# Patient Record
Sex: Female | Born: 1940 | Race: White | Hispanic: No | Marital: Married
Health system: Southern US, Community
[De-identification: ages and names within clinical notes are randomized; demographics above are authoritative.]

---

## 2001-04-21 ENCOUNTER — Encounter (HOSPITAL_COMMUNITY): Admission: RE | Admit: 2001-04-21 | Discharge: 2001-05-21 | Payer: Self-pay | Admitting: Specialist

## 2003-10-01 ENCOUNTER — Other Ambulatory Visit: Payer: Self-pay

## 2003-11-07 ENCOUNTER — Other Ambulatory Visit: Payer: Self-pay

## 2003-11-07 ENCOUNTER — Ambulatory Visit: Payer: Self-pay | Admitting: Specialist

## 2004-02-14 ENCOUNTER — Ambulatory Visit: Payer: Self-pay | Admitting: Specialist

## 2004-05-21 ENCOUNTER — Emergency Department: Payer: Self-pay | Admitting: General Practice

## 2005-10-28 ENCOUNTER — Observation Stay: Payer: Self-pay | Admitting: Specialist

## 2005-10-28 ENCOUNTER — Other Ambulatory Visit: Payer: Self-pay

## 2005-10-29 ENCOUNTER — Other Ambulatory Visit: Payer: Self-pay

## 2006-01-11 ENCOUNTER — Ambulatory Visit: Payer: Self-pay | Admitting: Specialist

## 2006-01-13 ENCOUNTER — Inpatient Hospital Stay: Payer: Self-pay | Admitting: Specialist

## 2006-02-08 ENCOUNTER — Ambulatory Visit: Payer: Self-pay | Admitting: Specialist

## 2006-04-21 ENCOUNTER — Ambulatory Visit: Payer: Self-pay | Admitting: Specialist

## 2006-07-01 ENCOUNTER — Emergency Department: Payer: Self-pay | Admitting: General Practice

## 2006-07-01 ENCOUNTER — Other Ambulatory Visit: Payer: Self-pay

## 2006-10-21 ENCOUNTER — Ambulatory Visit: Payer: Self-pay | Admitting: Ophthalmology

## 2006-10-21 ENCOUNTER — Other Ambulatory Visit: Payer: Self-pay

## 2006-10-27 ENCOUNTER — Ambulatory Visit: Payer: Self-pay | Admitting: Ophthalmology

## 2007-06-28 ENCOUNTER — Other Ambulatory Visit: Payer: Self-pay

## 2007-06-28 ENCOUNTER — Emergency Department: Payer: Self-pay | Admitting: Emergency Medicine

## 2007-07-21 ENCOUNTER — Ambulatory Visit: Payer: Self-pay | Admitting: Cardiovascular Disease

## 2007-08-26 ENCOUNTER — Emergency Department: Payer: Self-pay | Admitting: Emergency Medicine

## 2007-09-05 ENCOUNTER — Emergency Department (HOSPITAL_COMMUNITY): Admission: EM | Admit: 2007-09-05 | Discharge: 2007-09-05 | Payer: Self-pay | Admitting: Emergency Medicine

## 2008-01-19 ENCOUNTER — Ambulatory Visit: Payer: Self-pay | Admitting: Cardiovascular Disease

## 2008-09-13 ENCOUNTER — Ambulatory Visit: Payer: Self-pay | Admitting: Cardiovascular Disease

## 2009-04-03 ENCOUNTER — Inpatient Hospital Stay: Payer: Self-pay | Admitting: Internal Medicine

## 2009-06-09 ENCOUNTER — Inpatient Hospital Stay: Payer: Self-pay | Admitting: Internal Medicine

## 2009-09-17 ENCOUNTER — Inpatient Hospital Stay: Payer: Self-pay | Admitting: Internal Medicine

## 2009-11-03 ENCOUNTER — Observation Stay: Payer: Self-pay | Admitting: Internal Medicine

## 2010-08-21 ENCOUNTER — Inpatient Hospital Stay: Payer: Self-pay | Admitting: Internal Medicine

## 2010-08-28 LAB — PATHOLOGY REPORT

## 2011-01-22 ENCOUNTER — Ambulatory Visit: Payer: Self-pay | Admitting: Internal Medicine

## 2011-02-18 LAB — URINALYSIS, COMPLETE
Bilirubin,UR: NEGATIVE
Glucose,UR: NEGATIVE mg/dL (ref 0–75)
Hyaline Cast: 3
Leukocyte Esterase: NEGATIVE
Nitrite: NEGATIVE
Ph: 5 (ref 4.5–8.0)
Specific Gravity: 1.016 (ref 1.003–1.030)
Squamous Epithelial: NONE SEEN

## 2011-02-18 LAB — LIPASE, BLOOD: Lipase: 82 U/L (ref 73–393)

## 2011-02-18 LAB — CBC
HGB: 11.7 g/dL — ABNORMAL LOW (ref 12.0–16.0)
RBC: 3.99 10*6/uL (ref 3.80–5.20)
WBC: 7.3 10*3/uL (ref 3.6–11.0)

## 2011-02-18 LAB — RAPID INFLUENZA A&B ANTIGENS

## 2011-02-18 LAB — COMPREHENSIVE METABOLIC PANEL
Albumin: 4 g/dL (ref 3.4–5.0)
Alkaline Phosphatase: 65 U/L (ref 50–136)
Anion Gap: 16 (ref 7–16)
BUN: 36 mg/dL — ABNORMAL HIGH (ref 7–18)
Glucose: 133 mg/dL — ABNORMAL HIGH (ref 65–99)
Potassium: 3.9 mmol/L (ref 3.5–5.1)
SGOT(AST): 34 U/L (ref 15–37)
SGPT (ALT): 28 U/L
Total Protein: 8.7 g/dL — ABNORMAL HIGH (ref 6.4–8.2)

## 2011-02-18 LAB — TROPONIN I: Troponin-I: <0.02 ng/mL

## 2011-02-19 LAB — CBC WITH DIFFERENTIAL/PLATELET
Basophil #: 0.1 10*3/uL (ref 0.0–0.1)
Basophil %: 0.8 %
Eosinophil #: 0.3 10*3/uL (ref 0.0–0.7)
Eosinophil %: 4.4 %
HGB: 10 g/dL — ABNORMAL LOW (ref 12.0–16.0)
Lymphocyte %: 54.2 %
MCH: 29.1 pg (ref 26.0–34.0)
Monocyte #: 0.8 10*3/uL — ABNORMAL HIGH (ref 0.0–0.7)
Monocyte %: 10.9 %
Neutrophil #: 2.1 10*3/uL (ref 1.4–6.5)
Neutrophil %: 29.7 %
RBC: 3.43 10*6/uL — ABNORMAL LOW (ref 3.80–5.20)
WBC: 7.2 10*3/uL (ref 3.6–11.0)

## 2011-02-19 LAB — COMPREHENSIVE METABOLIC PANEL
Alkaline Phosphatase: 52 U/L (ref 50–136)
BUN: 17 mg/dL (ref 7–18)
Bilirubin,Total: 0.5 mg/dL (ref 0.2–1.0)
Creatinine: 1.26 mg/dL (ref 0.60–1.30)
EGFR (African American): 54 — ABNORMAL LOW
SGPT (ALT): 20 U/L
Sodium: 144 mmol/L (ref 136–145)
Total Protein: 7.2 g/dL (ref 6.4–8.2)

## 2011-02-19 LAB — MAGNESIUM: Magnesium: 1.9 mg/dL

## 2011-02-20 ENCOUNTER — Inpatient Hospital Stay: Payer: Self-pay | Admitting: Internal Medicine

## 2011-03-10 ENCOUNTER — Inpatient Hospital Stay: Payer: Self-pay | Admitting: Internal Medicine

## 2011-03-10 LAB — CBC WITH DIFFERENTIAL/PLATELET
Basophil %: 0.8 %
Eosinophil %: 1 %
HCT: 33.7 % — ABNORMAL LOW (ref 35.0–47.0)
Lymphocyte #: 3.1 10*3/uL (ref 1.0–3.6)
MCH: 30.1 pg (ref 26.0–34.0)
Monocyte #: 0.5 10*3/uL (ref 0.0–0.7)
Monocyte %: 6.9 %
Neutrophil %: 45.5 %
RBC: 3.79 10*6/uL — ABNORMAL LOW (ref 3.80–5.20)
WBC: 6.7 10*3/uL (ref 3.6–11.0)

## 2011-03-10 LAB — COMPREHENSIVE METABOLIC PANEL
Alkaline Phosphatase: 59 U/L (ref 50–136)
BUN: 88 mg/dL — ABNORMAL HIGH (ref 7–18)
Calcium, Total: 9.2 mg/dL (ref 8.5–10.1)
Co2: 21 mmol/L (ref 21–32)
EGFR (African American): 6 — ABNORMAL LOW
EGFR (Non-African Amer.): 5 — ABNORMAL LOW
Osmolality: 291 (ref 275–301)
SGOT(AST): 18 U/L (ref 15–37)
SGPT (ALT): 19 U/L
Sodium: 131 mmol/L — ABNORMAL LOW (ref 136–145)

## 2011-03-10 LAB — URINALYSIS, COMPLETE
Ketone: NEGATIVE
Nitrite: NEGATIVE
Ph: 5 (ref 4.5–8.0)
Protein: NEGATIVE
RBC,UR: 3 /HPF (ref 0–5)

## 2011-03-10 LAB — SODIUM, URINE, RANDOM: Sodium, Urine Random: 38 mmol/L (ref 20–110)

## 2011-03-11 LAB — COMPREHENSIVE METABOLIC PANEL
Anion Gap: 11 (ref 7–16)
BUN: 78 mg/dL — ABNORMAL HIGH (ref 7–18)
Calcium, Total: 8.6 mg/dL (ref 8.5–10.1)
Chloride: 100 mmol/L (ref 98–107)
EGFR (African American): 9 — ABNORMAL LOW
EGFR (Non-African Amer.): 8 — ABNORMAL LOW
Osmolality: 287 (ref 275–301)
Potassium: 4.7 mmol/L (ref 3.5–5.1)
SGOT(AST): 19 U/L (ref 15–37)
Sodium: 132 mmol/L — ABNORMAL LOW (ref 136–145)
Total Protein: 7 g/dL (ref 6.4–8.2)

## 2011-03-11 LAB — UR PROT ELECTROPHORESIS, URINE RANDOM

## 2011-03-11 LAB — PROTEIN ELECTROPHORESIS(ARMC)

## 2011-03-12 LAB — BASIC METABOLIC PANEL
BUN: 52 mg/dL — ABNORMAL HIGH (ref 7–18)
Creatinine: 3.03 mg/dL — ABNORMAL HIGH (ref 0.60–1.30)
EGFR (African American): 20 — ABNORMAL LOW
EGFR (Non-African Amer.): 16 — ABNORMAL LOW
Glucose: 87 mg/dL (ref 65–99)
Potassium: 5.3 mmol/L — ABNORMAL HIGH (ref 3.5–5.1)
Sodium: 140 mmol/L (ref 136–145)

## 2011-03-12 LAB — URINE CULTURE

## 2011-03-13 LAB — BASIC METABOLIC PANEL
BUN: 26 mg/dL — ABNORMAL HIGH (ref 7–18)
Creatinine: 1.77 mg/dL — ABNORMAL HIGH (ref 0.60–1.30)
EGFR (African American): 37 — ABNORMAL LOW
EGFR (Non-African Amer.): 30 — ABNORMAL LOW
Glucose: 135 mg/dL — ABNORMAL HIGH (ref 65–99)
Potassium: 4.6 mmol/L (ref 3.5–5.1)
Sodium: 143 mmol/L (ref 136–145)

## 2011-03-14 LAB — CBC WITH DIFFERENTIAL/PLATELET
Basophil #: 0.1 10*3/uL (ref 0.0–0.1)
Basophil %: 0.9 %
Eosinophil %: 4.5 %
HGB: 10.1 g/dL — ABNORMAL LOW (ref 12.0–16.0)
Lymphocyte #: 3.9 10*3/uL — ABNORMAL HIGH (ref 1.0–3.6)
Lymphocyte %: 65.4 %
MCH: 30.5 pg (ref 26.0–34.0)
MCV: 90 fL (ref 80–100)
Monocyte #: 0.5 10*3/uL (ref 0.0–0.7)
Platelet: 142 10*3/uL — ABNORMAL LOW (ref 150–440)
WBC: 6 10*3/uL (ref 3.6–11.0)

## 2011-03-14 LAB — BASIC METABOLIC PANEL
BUN: 10 mg/dL (ref 7–18)
Chloride: 107 mmol/L (ref 98–107)
Creatinine: 1.6 mg/dL — ABNORMAL HIGH (ref 0.60–1.30)
EGFR (Non-African Amer.): 34 — ABNORMAL LOW
Potassium: 4.2 mmol/L (ref 3.5–5.1)
Sodium: 140 mmol/L (ref 136–145)

## 2011-03-15 LAB — BASIC METABOLIC PANEL
Anion Gap: 8 (ref 7–16)
BUN: 5 mg/dL — ABNORMAL LOW (ref 7–18)
Chloride: 108 mmol/L — ABNORMAL HIGH (ref 98–107)
EGFR (African American): 47 — ABNORMAL LOW
EGFR (Non-African Amer.): 39 — ABNORMAL LOW
Glucose: 116 mg/dL — ABNORMAL HIGH (ref 65–99)
Osmolality: 278 (ref 275–301)

## 2011-03-15 LAB — CBC WITH DIFFERENTIAL/PLATELET
Basophil %: 0.8 %
Eosinophil #: 0.3 10*3/uL (ref 0.0–0.7)
HCT: 28.5 % — ABNORMAL LOW (ref 35.0–47.0)
HGB: 9.6 g/dL — ABNORMAL LOW (ref 12.0–16.0)
Lymphocyte #: 3 10*3/uL (ref 1.0–3.6)
Lymphocyte %: 59.9 %
MCH: 30.2 pg (ref 26.0–34.0)
MCHC: 33.7 g/dL (ref 32.0–36.0)
MCV: 90 fL (ref 80–100)
Monocyte #: 0.5 10*3/uL (ref 0.0–0.7)
Neutrophil #: 1.1 10*3/uL — ABNORMAL LOW (ref 1.4–6.5)
RDW: 16.3 % — ABNORMAL HIGH (ref 11.5–14.5)

## 2011-03-16 LAB — BASIC METABOLIC PANEL
Anion Gap: 10 (ref 7–16)
BUN: 3 mg/dL — ABNORMAL LOW (ref 7–18)
Creatinine: 1.2 mg/dL (ref 0.60–1.30)
EGFR (African American): 57 — ABNORMAL LOW
EGFR (Non-African Amer.): 47 — ABNORMAL LOW
Glucose: 115 mg/dL — ABNORMAL HIGH (ref 65–99)
Sodium: 143 mmol/L (ref 136–145)

## 2011-03-16 LAB — CBC WITH DIFFERENTIAL/PLATELET
Eosinophil #: 0.4 10*3/uL (ref 0.0–0.7)
HGB: 9.3 g/dL — ABNORMAL LOW (ref 12.0–16.0)
Monocyte #: 0.5 10*3/uL (ref 0.0–0.7)
Neutrophil %: 25.1 %
Platelet: 123 10*3/uL — ABNORMAL LOW (ref 150–440)
RBC: 3.1 10*6/uL — ABNORMAL LOW (ref 3.80–5.20)

## 2011-03-17 LAB — BASIC METABOLIC PANEL
Chloride: 109 mmol/L — ABNORMAL HIGH (ref 98–107)
Creatinine: 1.44 mg/dL — ABNORMAL HIGH (ref 0.60–1.30)
Glucose: 113 mg/dL — ABNORMAL HIGH (ref 65–99)
Osmolality: 279 (ref 275–301)

## 2011-03-17 LAB — CBC WITH DIFFERENTIAL/PLATELET
Basophil %: 0.8 %
Eosinophil %: 7.4 %
HCT: 28.6 % — ABNORMAL LOW (ref 35.0–47.0)
HGB: 9.6 g/dL — ABNORMAL LOW (ref 12.0–16.0)
Lymphocyte #: 2.9 10*3/uL (ref 1.0–3.6)
MCH: 30.2 pg (ref 26.0–34.0)
MCV: 91 fL (ref 80–100)
Monocyte #: 0.4 10*3/uL (ref 0.0–0.7)
Monocyte %: 8.2 %
Neutrophil #: 1.1 10*3/uL — ABNORMAL LOW (ref 1.4–6.5)
Platelet: 136 10*3/uL — ABNORMAL LOW (ref 150–440)
RBC: 3.16 10*6/uL — ABNORMAL LOW (ref 3.80–5.20)
WBC: 4.8 10*3/uL (ref 3.6–11.0)

## 2011-03-18 LAB — BASIC METABOLIC PANEL
Anion Gap: 10 (ref 7–16)
Chloride: 115 mmol/L — ABNORMAL HIGH (ref 98–107)
Co2: 24 mmol/L (ref 21–32)
Osmolality: 293 (ref 275–301)

## 2011-03-18 LAB — CBC WITH DIFFERENTIAL/PLATELET
Basophil #: 0.1 10*3/uL (ref 0.0–0.1)
Eosinophil #: 0.4 10*3/uL (ref 0.0–0.7)
HCT: 26.4 % — ABNORMAL LOW (ref 35.0–47.0)
HGB: 8.6 g/dL — ABNORMAL LOW (ref 12.0–16.0)
Lymphocyte #: 2.5 10*3/uL (ref 1.0–3.6)
MCHC: 32.6 g/dL (ref 32.0–36.0)
Neutrophil #: 1.8 10*3/uL (ref 1.4–6.5)
RBC: 2.91 10*6/uL — ABNORMAL LOW (ref 3.80–5.20)
RDW: 15 % — ABNORMAL HIGH (ref 11.5–14.5)
WBC: 5.3 10*3/uL (ref 3.6–11.0)

## 2011-03-18 LAB — MAGNESIUM: Magnesium: 1.5 mg/dL — ABNORMAL LOW

## 2011-03-19 LAB — BASIC METABOLIC PANEL
Calcium, Total: 8 mg/dL — ABNORMAL LOW (ref 8.5–10.1)
Chloride: 114 mmol/L — ABNORMAL HIGH (ref 98–107)
Co2: 23 mmol/L (ref 21–32)
EGFR (African American): >60
Glucose: 109 mg/dL — ABNORMAL HIGH (ref 65–99)
Potassium: 4.2 mmol/L (ref 3.5–5.1)

## 2011-03-19 LAB — CBC WITH DIFFERENTIAL/PLATELET
Basophil #: 0 10*3/uL (ref 0.0–0.1)
Basophil %: 0.5 %
Eosinophil #: 0.4 10*3/uL (ref 0.0–0.7)
HCT: 25.4 % — ABNORMAL LOW (ref 35.0–47.0)
Lymphocyte %: 43.8 %
MCH: 30.1 pg (ref 26.0–34.0)
MCHC: 33.3 g/dL (ref 32.0–36.0)
Monocyte #: 0.6 10*3/uL (ref 0.0–0.7)
Neutrophil #: 2.5 10*3/uL (ref 1.4–6.5)
Neutrophil %: 39.7 %
RDW: 16.8 % — ABNORMAL HIGH (ref 11.5–14.5)

## 2011-03-19 LAB — MAGNESIUM
Magnesium: 1.5 mg/dL — ABNORMAL LOW
Magnesium: 2.3 mg/dL

## 2011-07-14 ENCOUNTER — Ambulatory Visit: Payer: Self-pay | Admitting: Internal Medicine

## 2011-07-14 LAB — CREATININE, SERUM: Creatinine: 1.12 mg/dL (ref 0.60–1.30)

## 2011-08-11 ENCOUNTER — Emergency Department: Payer: Self-pay | Admitting: Unknown Physician Specialty

## 2011-08-11 LAB — CBC
HCT: 33.2 % — ABNORMAL LOW (ref 35.0–47.0)
HGB: 10.9 g/dL — ABNORMAL LOW (ref 12.0–16.0)
MCH: 28.9 pg (ref 26.0–34.0)
MCHC: 33 g/dL (ref 32.0–36.0)
MCV: 88 fL (ref 80–100)
Platelet: 190 10*3/uL (ref 150–440)
RBC: 3.79 10*6/uL — ABNORMAL LOW (ref 3.80–5.20)
RDW: 14.5 % (ref 11.5–14.5)
WBC: 7.4 10*3/uL (ref 3.6–11.0)

## 2011-08-11 LAB — BASIC METABOLIC PANEL
Anion Gap: 8 (ref 7–16)
BUN: 13 mg/dL (ref 7–18)
Calcium, Total: 8.6 mg/dL (ref 8.5–10.1)
Chloride: 108 mmol/L — ABNORMAL HIGH (ref 98–107)
Co2: 28 mmol/L (ref 21–32)
Creatinine: 1.2 mg/dL (ref 0.60–1.30)
EGFR (African American): 53 — ABNORMAL LOW
EGFR (Non-African Amer.): 46 — ABNORMAL LOW
Glucose: 182 mg/dL — ABNORMAL HIGH (ref 65–99)
Osmolality: 292 (ref 275–301)
Potassium: 4.5 mmol/L (ref 3.5–5.1)
Sodium: 144 mmol/L (ref 136–145)

## 2011-08-11 LAB — HEPATIC FUNCTION PANEL A (ARMC)
Albumin: 3.4 g/dL (ref 3.4–5.0)
Alkaline Phosphatase: 92 U/L (ref 50–136)
Bilirubin, Direct: <0.1 mg/dL (ref 0.00–0.20)
Bilirubin,Total: 0.2 mg/dL (ref 0.2–1.0)
SGOT(AST): 29 U/L (ref 15–37)
SGPT (ALT): 26 U/L
Total Protein: 7.6 g/dL (ref 6.4–8.2)

## 2011-08-11 LAB — TROPONIN I: Troponin-I: 0.03 ng/mL

## 2011-08-11 LAB — CK TOTAL AND CKMB (NOT AT ARMC)
CK, Total: 105 U/L (ref 21–215)
CK-MB: 0.5 ng/mL (ref 0.5–3.6)

## 2011-08-11 LAB — PRO B NATRIURETIC PEPTIDE: B-Type Natriuretic Peptide: 207 pg/mL — ABNORMAL HIGH (ref 0–125)

## 2011-08-11 LAB — MAGNESIUM: Magnesium: 1.7 mg/dL — ABNORMAL LOW

## 2012-04-10 IMAGING — CR DG UGI W/ SMALL BOWEL
1 series · 2 of 2 positions shown · non-contrast
Comparison: none

REASON FOR EXAM: vomiting.
COMMENTS:

PROCEDURE:     FL  - FL UPPER GI WITH SMALL BOWEL  - August 29, 2010  [DATE]
RESULT:     Comparisons:  None

[Series 1: view not recorded · 0.17mm/px · 2 of 2 slices shown]
[im 1/2]
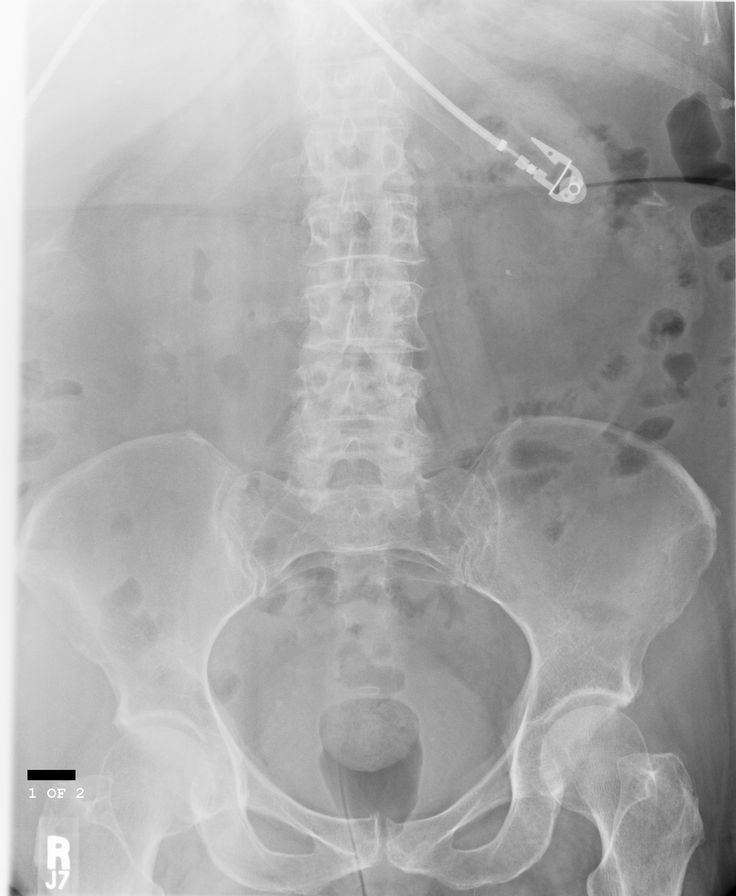
[im 2/2]
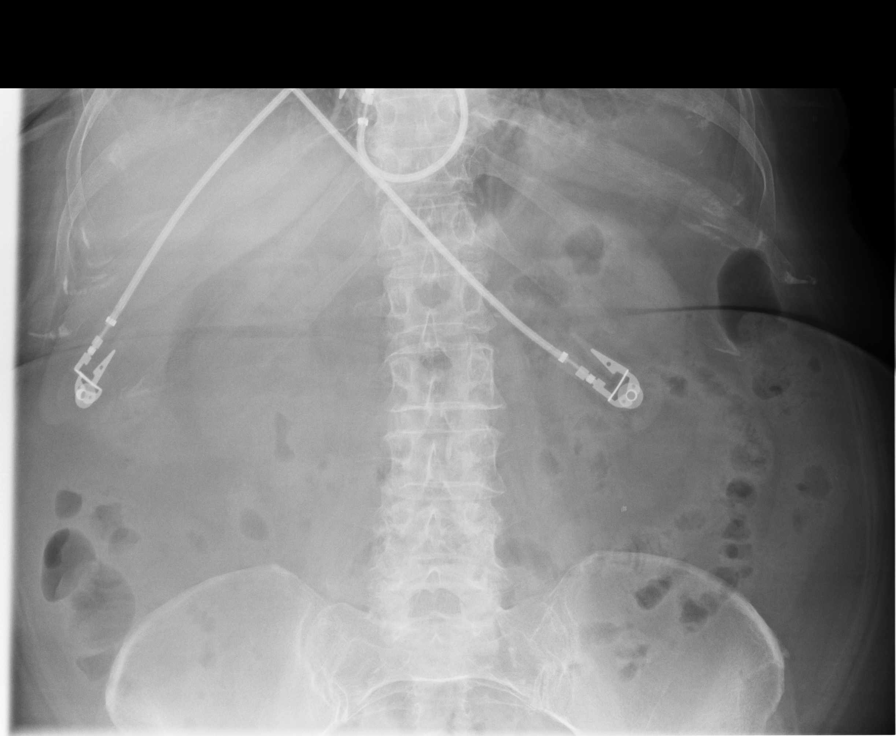

[2 of 2 positions shown; findings below may reference images not displayed]

FINDINGS: Supine radiograph of the abdomen is provided.

There is a nonspecific bowel gas pattern. There is no bowel dilatation to
suggest obstruction. There is no pathologic calcification along the expected
course of the ureters. There is no evidence of pneumoperitoneum, portal
venous gas, or pneumatosis.

The osseous structures are unremarkable.
IMPRESSION: Unremarkable KUB.

The patient was unable to tolerate the upper GI examination.

## 2012-09-08 ENCOUNTER — Observation Stay: Payer: Self-pay | Admitting: Internal Medicine

## 2012-09-08 LAB — URINALYSIS, COMPLETE
Bilirubin,UR: NEGATIVE
Glucose,UR: NEGATIVE mg/dL (ref 0–75)
Hyaline Cast: 21
Ph: 5 (ref 4.5–8.0)
Protein: NEGATIVE
RBC,UR: 2 /HPF (ref 0–5)
Specific Gravity: 1.008 (ref 1.003–1.030)

## 2012-09-08 LAB — TROPONIN I
Troponin-I: <0.02 ng/mL
Troponin-I: <0.02 ng/mL

## 2012-09-08 LAB — CK TOTAL AND CKMB (NOT AT ARMC)
CK, Total: 103 U/L (ref 21–215)
CK, Total: 99 U/L (ref 21–215)
CK-MB: <0.5 ng/mL — ABNORMAL LOW (ref 0.5–3.6)
CK-MB: <0.5 ng/mL — ABNORMAL LOW (ref 0.5–3.6)

## 2012-09-08 LAB — COMPREHENSIVE METABOLIC PANEL
Albumin: 3.6 g/dL (ref 3.4–5.0)
Alkaline Phosphatase: 86 U/L (ref 50–136)
Bilirubin,Total: 0.4 mg/dL (ref 0.2–1.0)
Calcium, Total: 8.8 mg/dL (ref 8.5–10.1)
Glucose: 140 mg/dL — ABNORMAL HIGH (ref 65–99)
SGOT(AST): 31 U/L (ref 15–37)
SGPT (ALT): 26 U/L (ref 12–78)
Total Protein: 7.9 g/dL (ref 6.4–8.2)

## 2012-09-08 LAB — CBC
HCT: 32.4 % — ABNORMAL LOW (ref 35.0–47.0)
MCHC: 35.2 g/dL (ref 32.0–36.0)
Platelet: 236 10*3/uL (ref 150–440)
RBC: 3.77 10*6/uL — ABNORMAL LOW (ref 3.80–5.20)
RDW: 13.7 % (ref 11.5–14.5)

## 2012-09-08 LAB — PRO B NATRIURETIC PEPTIDE: B-Type Natriuretic Peptide: 217 pg/mL — ABNORMAL HIGH (ref 0–125)

## 2012-09-09 LAB — BASIC METABOLIC PANEL
Anion Gap: 5 — ABNORMAL LOW (ref 7–16)
BUN: 26 mg/dL — ABNORMAL HIGH (ref 7–18)
Chloride: 95 mmol/L — ABNORMAL LOW (ref 98–107)
Co2: 30 mmol/L (ref 21–32)
EGFR (African American): 38 — ABNORMAL LOW
Glucose: 109 mg/dL — ABNORMAL HIGH (ref 65–99)
Osmolality: 266 (ref 275–301)

## 2012-09-09 LAB — CBC WITH DIFFERENTIAL/PLATELET
Basophil #: 0.1 10*3/uL (ref 0.0–0.1)
Eosinophil #: 0.5 10*3/uL (ref 0.0–0.7)
Lymphocyte #: 3.7 10*3/uL — ABNORMAL HIGH (ref 1.0–3.6)
MCHC: 35.3 g/dL (ref 32.0–36.0)
MCV: 87 fL (ref 80–100)
Monocyte #: 0.9 x10 3/mm (ref 0.2–0.9)
WBC: 7.6 10*3/uL (ref 3.6–11.0)

## 2012-09-09 LAB — LIPID PANEL
Cholesterol: 161 mg/dL (ref 0–200)
HDL Cholesterol: 48 mg/dL (ref 40–60)
Triglycerides: 132 mg/dL (ref 0–200)
VLDL Cholesterol, Calc: 26 mg/dL (ref 5–40)

## 2012-09-09 LAB — HEMOGLOBIN A1C: Hemoglobin A1C: 7.2 % — ABNORMAL HIGH (ref 4.2–6.3)

## 2012-09-09 LAB — CK TOTAL AND CKMB (NOT AT ARMC)
CK, Total: 95 U/L (ref 21–215)
CK-MB: 0.8 ng/mL (ref 0.5–3.6)

## 2012-09-10 LAB — HEMOGLOBIN: HGB: 11.2 g/dL — ABNORMAL LOW (ref 12.0–16.0)

## 2012-09-10 LAB — CREATININE, SERUM: EGFR (African American): 45 — ABNORMAL LOW

## 2012-09-10 LAB — BASIC METABOLIC PANEL
BUN: 23 mg/dL — ABNORMAL HIGH (ref 7–18)
Calcium, Total: 9 mg/dL (ref 8.5–10.1)
Chloride: 98 mmol/L (ref 98–107)
EGFR (African American): 44 — ABNORMAL LOW
EGFR (Non-African Amer.): 38 — ABNORMAL LOW
Sodium: 130 mmol/L — ABNORMAL LOW (ref 136–145)

## 2012-09-12 ENCOUNTER — Emergency Department: Payer: Self-pay | Admitting: Emergency Medicine

## 2012-09-12 LAB — APTT: Activated PTT: 29.5 secs (ref 23.6–35.9)

## 2012-09-12 LAB — COMPREHENSIVE METABOLIC PANEL
Anion Gap: 6 — ABNORMAL LOW (ref 7–16)
Bilirubin,Total: 0.4 mg/dL (ref 0.2–1.0)
Creatinine: 1.12 mg/dL (ref 0.60–1.30)
EGFR (Non-African Amer.): 49 — ABNORMAL LOW
Osmolality: 267 (ref 275–301)
SGOT(AST): 25 U/L (ref 15–37)

## 2012-09-12 LAB — CBC
HCT: 33.3 % — ABNORMAL LOW (ref 35.0–47.0)
MCH: 30.5 pg (ref 26.0–34.0)
MCHC: 35 g/dL (ref 32.0–36.0)
RBC: 3.82 10*6/uL (ref 3.80–5.20)
RDW: 14.3 % (ref 11.5–14.5)

## 2012-09-12 LAB — TROPONIN I: Troponin-I: <0.02 ng/mL

## 2012-09-12 LAB — PROTIME-INR: INR: 0.9

## 2012-09-16 LAB — CBC
HCT: 32.5 % — ABNORMAL LOW (ref 35.0–47.0)
MCH: 30.6 pg (ref 26.0–34.0)
Platelet: 252 10*3/uL (ref 150–440)
RBC: 3.73 10*6/uL — ABNORMAL LOW (ref 3.80–5.20)
RDW: 13.6 % (ref 11.5–14.5)
WBC: 7.6 10*3/uL (ref 3.6–11.0)

## 2012-09-16 LAB — BASIC METABOLIC PANEL
Calcium, Total: 8.8 mg/dL (ref 8.5–10.1)
Chloride: 93 mmol/L — ABNORMAL LOW (ref 98–107)
Co2: 28 mmol/L (ref 21–32)
Creatinine: 1.62 mg/dL — ABNORMAL HIGH (ref 0.60–1.30)
EGFR (African American): 37 — ABNORMAL LOW
Glucose: 135 mg/dL — ABNORMAL HIGH (ref 65–99)
Osmolality: 263 (ref 275–301)
Sodium: 128 mmol/L — ABNORMAL LOW (ref 136–145)

## 2012-09-16 LAB — CK TOTAL AND CKMB (NOT AT ARMC): CK, Total: 126 U/L (ref 21–215)

## 2012-09-16 LAB — PROTIME-INR
INR: 1
Prothrombin Time: 13.6 secs (ref 11.5–14.7)

## 2012-09-16 LAB — TROPONIN I: Troponin-I: <0.02 ng/mL

## 2012-09-17 ENCOUNTER — Observation Stay: Payer: Self-pay | Admitting: Internal Medicine

## 2012-09-17 LAB — TROPONIN I
Troponin-I: <0.02 ng/mL
Troponin-I: <0.02 ng/mL

## 2012-09-17 LAB — CK TOTAL AND CKMB (NOT AT ARMC)
CK, Total: 125 U/L (ref 21–215)
CK-MB: <0.5 ng/mL — ABNORMAL LOW (ref 0.5–3.6)
CK-MB: 0.9 ng/mL (ref 0.5–3.6)

## 2012-09-18 LAB — BASIC METABOLIC PANEL
Calcium, Total: 8.6 mg/dL (ref 8.5–10.1)
EGFR (African American): 51 — ABNORMAL LOW
Potassium: 4.2 mmol/L (ref 3.5–5.1)

## 2012-10-03 ENCOUNTER — Ambulatory Visit: Payer: Self-pay | Admitting: Internal Medicine

## 2012-10-12 ENCOUNTER — Inpatient Hospital Stay: Payer: Self-pay | Admitting: Internal Medicine

## 2012-10-12 LAB — CBC
HGB: 12.1 g/dL (ref 12.0–16.0)
MCH: 30 pg (ref 26.0–34.0)
MCHC: 35 g/dL (ref 32.0–36.0)
MCV: 86 fL (ref 80–100)
Platelet: 217 10*3/uL (ref 150–440)
RBC: 4.02 10*6/uL (ref 3.80–5.20)
RDW: 13.3 % (ref 11.5–14.5)
WBC: 7.7 10*3/uL (ref 3.6–11.0)

## 2012-10-12 LAB — URINALYSIS, COMPLETE
Bilirubin,UR: NEGATIVE
Blood: NEGATIVE
Glucose,UR: NEGATIVE mg/dL (ref 0–75)
Hyaline Cast: 39
Ketone: NEGATIVE
Nitrite: NEGATIVE
Ph: 5 (ref 4.5–8.0)
Protein: NEGATIVE
RBC,UR: <1 /HPF (ref 0–5)
Squamous Epithelial: 3
WBC UR: <1 /HPF (ref 0–5)

## 2012-10-12 LAB — COMPREHENSIVE METABOLIC PANEL
Anion Gap: 10 (ref 7–16)
Bilirubin,Total: 0.6 mg/dL (ref 0.2–1.0)
Calcium, Total: 8.9 mg/dL (ref 8.5–10.1)
Chloride: 82 mmol/L — ABNORMAL LOW (ref 98–107)
Co2: 25 mmol/L (ref 21–32)
EGFR (African American): 17 — ABNORMAL LOW
EGFR (Non-African Amer.): 15 — ABNORMAL LOW
Osmolality: 251 (ref 275–301)
Potassium: 3.5 mmol/L (ref 3.5–5.1)
SGPT (ALT): 25 U/L (ref 12–78)

## 2012-10-12 LAB — TROPONIN I: Troponin-I: <0.02 ng/mL

## 2012-10-12 LAB — PRO B NATRIURETIC PEPTIDE: B-Type Natriuretic Peptide: 191 pg/mL — ABNORMAL HIGH (ref 0–125)

## 2012-10-13 LAB — SODIUM, URINE, RANDOM: Sodium, Urine Random: 38 mmol/L (ref 20–110)

## 2012-10-13 LAB — CBC WITH DIFFERENTIAL/PLATELET
Basophil #: 0 10*3/uL (ref 0.0–0.1)
Basophil %: 0.8 %
HCT: 31.4 % — ABNORMAL LOW (ref 35.0–47.0)
HGB: 11.2 g/dL — ABNORMAL LOW (ref 12.0–16.0)
Lymphocyte #: 2.8 10*3/uL (ref 1.0–3.6)
MCH: 30.5 pg (ref 26.0–34.0)
MCHC: 35.5 g/dL (ref 32.0–36.0)
Neutrophil #: 2.3 10*3/uL (ref 1.4–6.5)
RBC: 3.66 10*6/uL — ABNORMAL LOW (ref 3.80–5.20)
RDW: 13.4 % (ref 11.5–14.5)

## 2012-10-13 LAB — COMPREHENSIVE METABOLIC PANEL
Alkaline Phosphatase: 67 U/L (ref 50–136)
Bilirubin,Total: 0.5 mg/dL (ref 0.2–1.0)
Calcium, Total: 8.3 mg/dL — ABNORMAL LOW (ref 8.5–10.1)
Chloride: 93 mmol/L — ABNORMAL LOW (ref 98–107)
Glucose: 103 mg/dL — ABNORMAL HIGH (ref 65–99)
Osmolality: 265 (ref 275–301)
SGOT(AST): 37 U/L (ref 15–37)
Sodium: 127 mmol/L — ABNORMAL LOW (ref 136–145)
Total Protein: 6.9 g/dL (ref 6.4–8.2)

## 2012-10-13 LAB — SODIUM: Sodium: 129 mmol/L — ABNORMAL LOW (ref 136–145)

## 2012-10-14 LAB — BASIC METABOLIC PANEL
Anion Gap: 7 (ref 7–16)
BUN: 24 mg/dL — ABNORMAL HIGH (ref 7–18)
Calcium, Total: 8.6 mg/dL (ref 8.5–10.1)
Co2: 23 mmol/L (ref 21–32)
Creatinine: 1.57 mg/dL — ABNORMAL HIGH (ref 0.60–1.30)
EGFR (Non-African Amer.): 33 — ABNORMAL LOW
Sodium: 132 mmol/L — ABNORMAL LOW (ref 136–145)

## 2012-10-15 LAB — BASIC METABOLIC PANEL
Anion Gap: 4 — ABNORMAL LOW (ref 7–16)
BUN: 14 mg/dL (ref 7–18)
Calcium, Total: 8.4 mg/dL — ABNORMAL LOW (ref 8.5–10.1)
Chloride: 105 mmol/L (ref 98–107)
EGFR (African American): 50 — ABNORMAL LOW
EGFR (Non-African Amer.): 43 — ABNORMAL LOW
Glucose: 89 mg/dL (ref 65–99)
Potassium: 4.2 mmol/L (ref 3.5–5.1)

## 2012-10-16 LAB — BASIC METABOLIC PANEL
Anion Gap: 7 (ref 7–16)
Creatinine: 1.12 mg/dL (ref 0.60–1.30)
EGFR (Non-African Amer.): 49 — ABNORMAL LOW
Glucose: 86 mg/dL (ref 65–99)

## 2012-10-17 LAB — PLATELET COUNT: Platelet: 148 10*3/uL — ABNORMAL LOW (ref 150–440)

## 2012-10-20 IMAGING — CR DG CHEST 2V
1 series · 3 of 3 positions shown · non-contrast
Comparison: none

REASON FOR EXAM: arf
COMMENTS:

[Series 1: ap · 0.17mm/px · 3 of 3 slices shown]
[im 1/3]
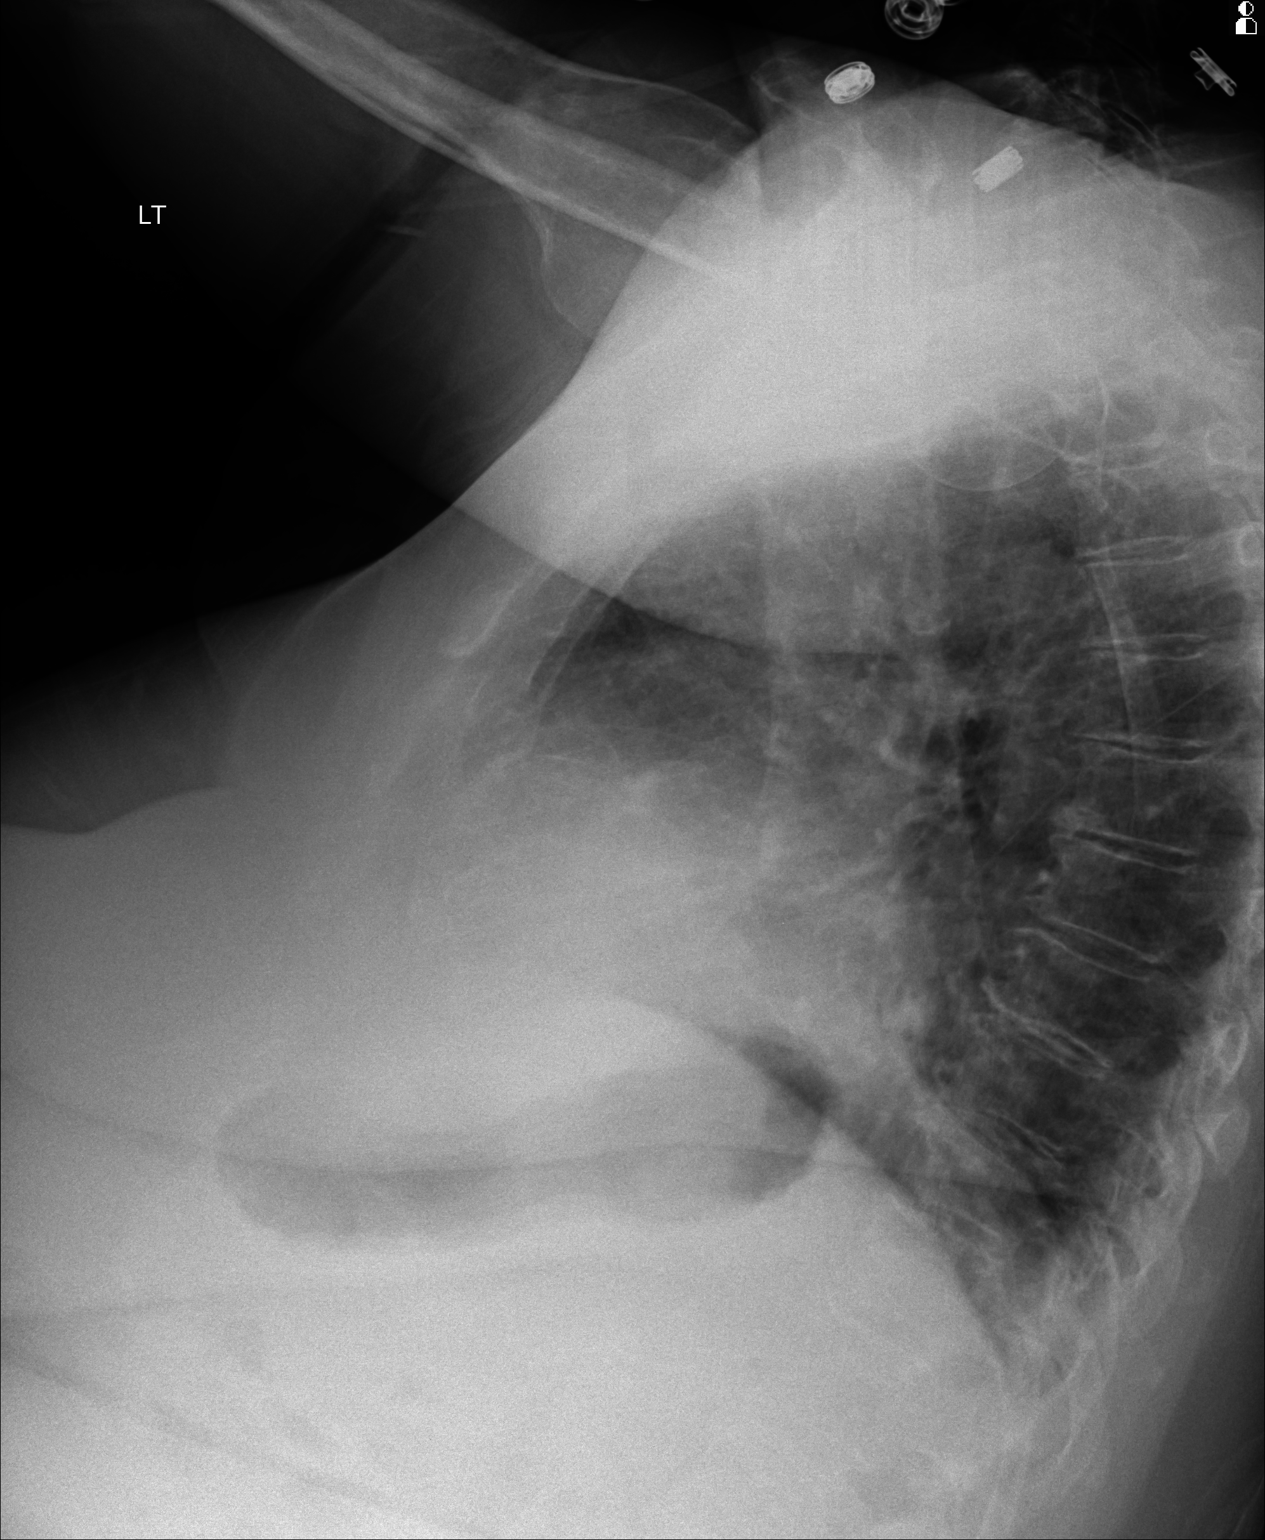
[im 2/3]
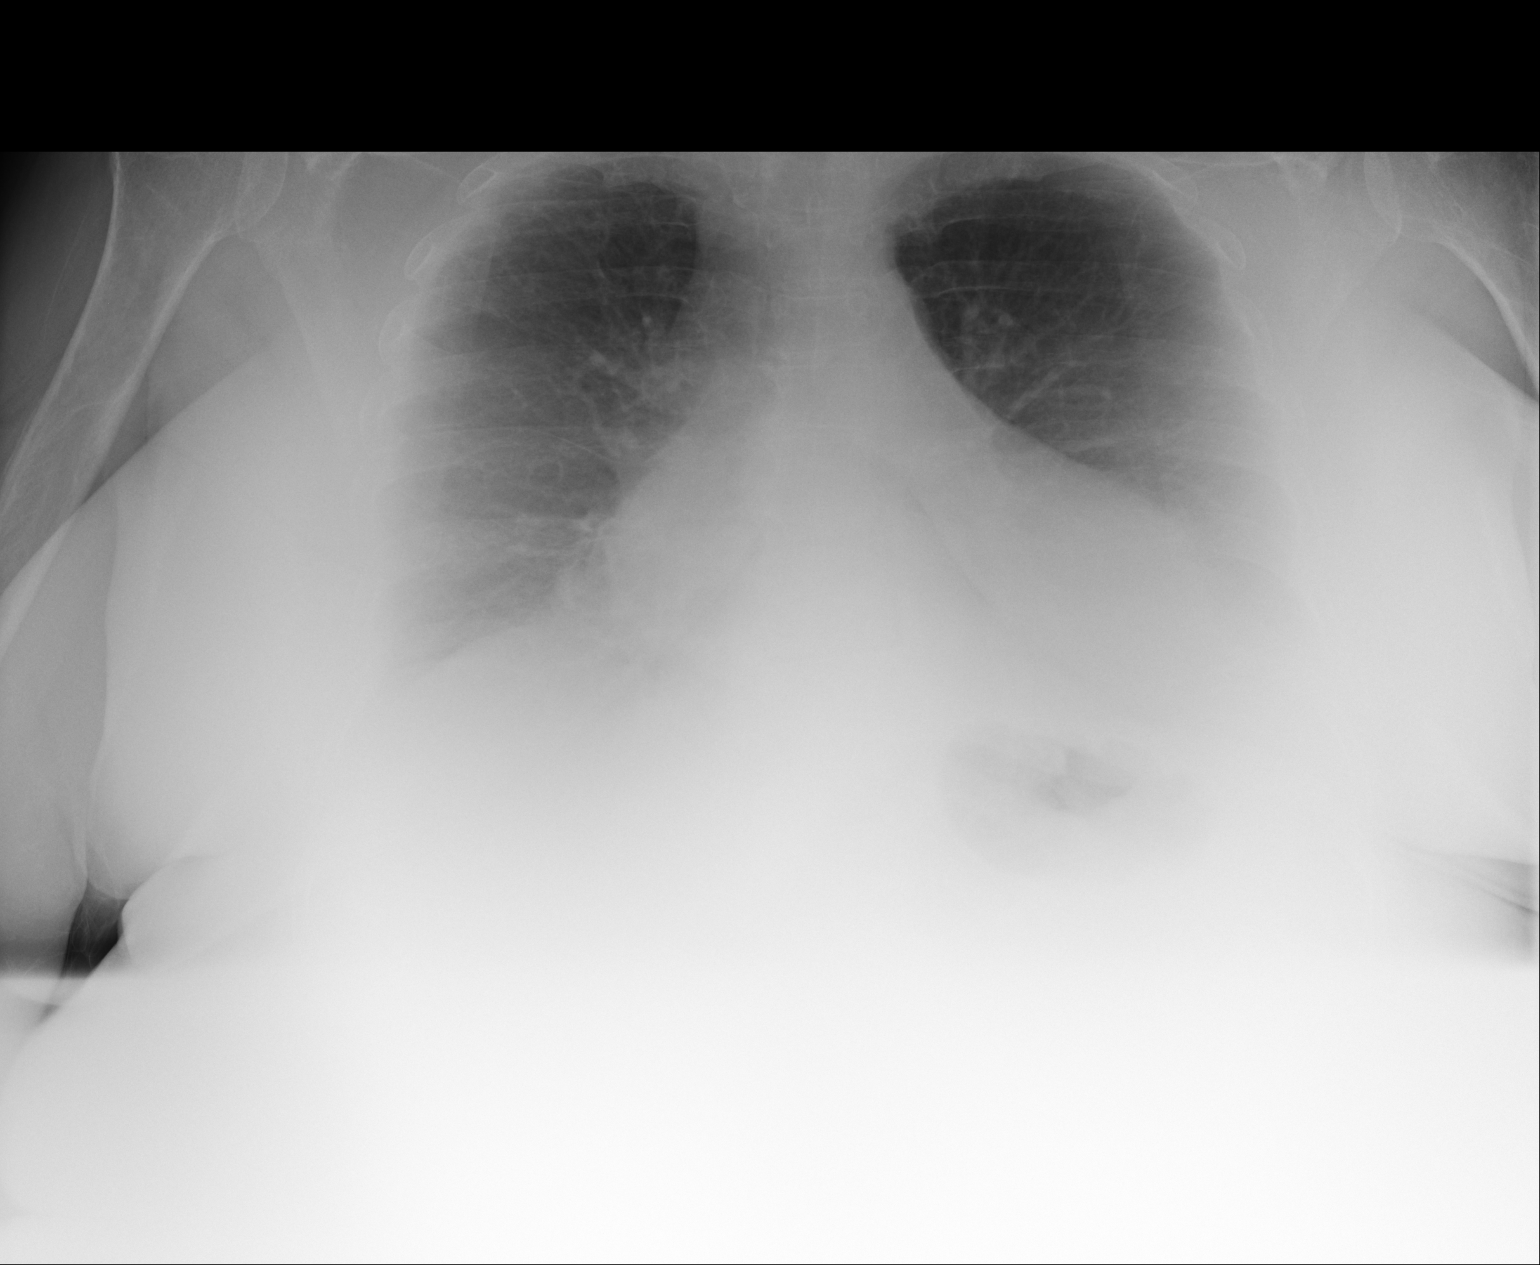
[im 3/3]
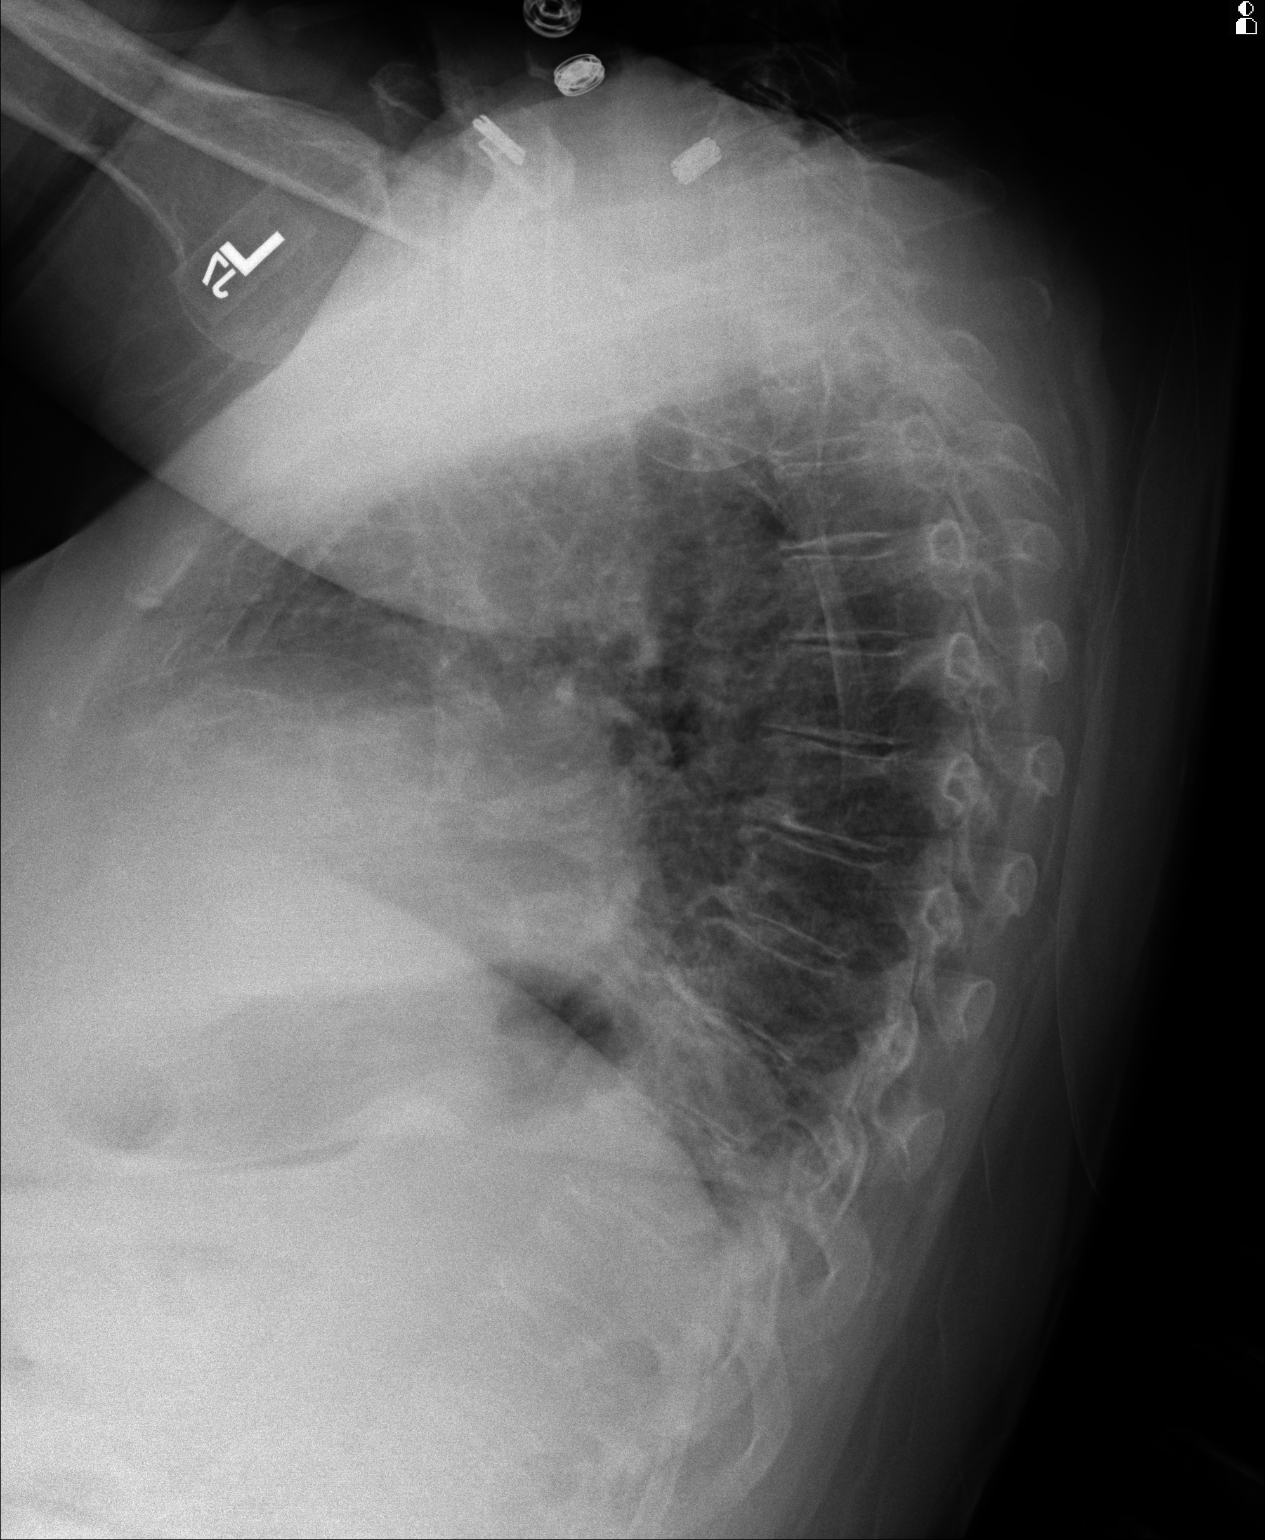

[3 of 3 positions shown; findings below may reference images not displayed]

PROCEDURE:     DXR - DXR CHEST PA (OR AP) AND LATERAL  - March 10, 2011  [DATE]

RESULT:     Comparison is made to the prior exam of 08/21/2010. The lung
fields are clear. No pneumonia, pneumothorax or pleural effusion is seen.
The heart is enlarged but appears stable in size as compared to the prior
exam.
IMPRESSION: 1. No acute changes are identified.
2. Stable cardiomegaly.

## 2012-10-30 LAB — COMPREHENSIVE METABOLIC PANEL
Albumin: 4 g/dL (ref 3.4–5.0)
Alkaline Phosphatase: 75 U/L (ref 50–136)
Anion Gap: 5 — ABNORMAL LOW (ref 7–16)
Calcium, Total: 8.7 mg/dL (ref 8.5–10.1)
Co2: 29 mmol/L (ref 21–32)
EGFR (African American): 25 — ABNORMAL LOW
EGFR (Non-African Amer.): 21 — ABNORMAL LOW
Potassium: 4.5 mmol/L (ref 3.5–5.1)
SGOT(AST): 30 U/L (ref 15–37)
SGPT (ALT): 23 U/L (ref 12–78)
Sodium: 124 mmol/L — ABNORMAL LOW (ref 136–145)
Total Protein: 7.7 g/dL (ref 6.4–8.2)

## 2012-10-30 LAB — URINALYSIS, COMPLETE
Blood: NEGATIVE
Ketone: NEGATIVE
Leukocyte Esterase: NEGATIVE
Nitrite: NEGATIVE
Ph: 6 (ref 4.5–8.0)
Protein: NEGATIVE
RBC,UR: 1 /HPF (ref 0–5)
Squamous Epithelial: 1

## 2012-10-30 LAB — CBC
HCT: 32.1 % — ABNORMAL LOW (ref 35.0–47.0)
HGB: 11.3 g/dL — ABNORMAL LOW (ref 12.0–16.0)
MCH: 31 pg (ref 26.0–34.0)
MCHC: 35.2 g/dL (ref 32.0–36.0)
MCV: 88 fL (ref 80–100)
Platelet: 271 10*3/uL (ref 150–440)
RDW: 13.6 % (ref 11.5–14.5)
WBC: 6.4 10*3/uL (ref 3.6–11.0)

## 2012-10-31 ENCOUNTER — Inpatient Hospital Stay: Payer: Self-pay | Admitting: Internal Medicine

## 2012-10-31 LAB — BASIC METABOLIC PANEL
Anion Gap: 8 (ref 7–16)
Chloride: 100 mmol/L (ref 98–107)
EGFR (Non-African Amer.): 31 — ABNORMAL LOW
Glucose: 117 mg/dL — ABNORMAL HIGH (ref 65–99)
Osmolality: 267 (ref 275–301)
Potassium: 4.3 mmol/L (ref 3.5–5.1)
Sodium: 131 mmol/L — ABNORMAL LOW (ref 136–145)

## 2012-11-01 LAB — CBC WITH DIFFERENTIAL/PLATELET
Basophil %: 1.1 %
Eosinophil #: 0.3 10*3/uL (ref 0.0–0.7)
Eosinophil %: 4.7 %
HCT: 32.3 % — ABNORMAL LOW (ref 35.0–47.0)
HGB: 11.1 g/dL — ABNORMAL LOW (ref 12.0–16.0)
Lymphocyte #: 2.5 10*3/uL (ref 1.0–3.6)
Lymphocyte %: 34.1 %
MCH: 30.7 pg (ref 26.0–34.0)
MCV: 89 fL (ref 80–100)
Monocyte %: 10 %
Neutrophil #: 3.6 10*3/uL (ref 1.4–6.5)
Neutrophil %: 50.1 %
Platelet: 268 10*3/uL (ref 150–440)
RBC: 3.63 10*6/uL — ABNORMAL LOW (ref 3.80–5.20)
RDW: 13.9 % (ref 11.5–14.5)

## 2012-11-01 LAB — BASIC METABOLIC PANEL
BUN: 12 mg/dL (ref 7–18)
Calcium, Total: 9.3 mg/dL (ref 8.5–10.1)
Co2: 27 mmol/L (ref 21–32)
Creatinine: 1.27 mg/dL (ref 0.60–1.30)
Glucose: 112 mg/dL — ABNORMAL HIGH (ref 65–99)

## 2012-11-02 ENCOUNTER — Ambulatory Visit: Payer: Self-pay | Admitting: Internal Medicine

## 2012-12-05 LAB — COMPREHENSIVE METABOLIC PANEL
Albumin: 4.2 g/dL (ref 3.4–5.0)
Alkaline Phosphatase: 80 U/L (ref 50–136)
Anion Gap: 7 (ref 7–16)
Bilirubin,Total: 0.6 mg/dL (ref 0.2–1.0)
Calcium, Total: 9.6 mg/dL (ref 8.5–10.1)
Chloride: 99 mmol/L (ref 98–107)
Co2: 23 mmol/L (ref 21–32)
EGFR (African American): 21 — ABNORMAL LOW
EGFR (Non-African Amer.): 18 — ABNORMAL LOW
Glucose: 91 mg/dL (ref 65–99)
Osmolality: 273 (ref 275–301)
Potassium: 5 mmol/L (ref 3.5–5.1)
Sodium: 129 mmol/L — ABNORMAL LOW (ref 136–145)

## 2012-12-05 LAB — CBC
HCT: 36.3 % (ref 35.0–47.0)
MCHC: 34.5 g/dL (ref 32.0–36.0)
Platelet: 243 10*3/uL (ref 150–440)
RBC: 4.06 10*6/uL (ref 3.80–5.20)
WBC: 7.6 10*3/uL (ref 3.6–11.0)

## 2012-12-06 ENCOUNTER — Inpatient Hospital Stay: Payer: Self-pay | Admitting: Internal Medicine

## 2012-12-06 LAB — URINALYSIS, COMPLETE
Bilirubin,UR: NEGATIVE
Blood: NEGATIVE
Glucose,UR: NEGATIVE mg/dL
Hyaline Cast: 15
Leukocyte Esterase: NEGATIVE
Nitrite: POSITIVE
Ph: 5
Protein: NEGATIVE
RBC,UR: <1 /HPF
Specific Gravity: 1.012
Squamous Epithelial: 2
WBC UR: 2 /HPF

## 2012-12-06 LAB — TSH: Thyroid Stimulating Horm: 2.45 u[IU]/mL

## 2012-12-07 LAB — CBC WITH DIFFERENTIAL/PLATELET
Basophil #: 0.1 10*3/uL (ref 0.0–0.1)
Basophil %: 1.2 %
Eosinophil #: 0.3 10*3/uL (ref 0.0–0.7)
Eosinophil %: 5 %
HCT: 30.5 % — ABNORMAL LOW (ref 35.0–47.0)
HGB: 10.7 g/dL — ABNORMAL LOW (ref 12.0–16.0)
Lymphocyte #: 2.3 10*3/uL (ref 1.0–3.6)
Lymphocyte %: 43.7 %
MCH: 31.3 pg (ref 26.0–34.0)
MCV: 89 fL (ref 80–100)
Monocyte %: 11.3 %
Platelet: 173 10*3/uL (ref 150–440)
RBC: 3.41 10*6/uL — ABNORMAL LOW (ref 3.80–5.20)

## 2012-12-07 LAB — BASIC METABOLIC PANEL
Anion Gap: 8 (ref 7–16)
Calcium, Total: 8.8 mg/dL (ref 8.5–10.1)
Co2: 19 mmol/L — ABNORMAL LOW (ref 21–32)
EGFR (African American): 37 — ABNORMAL LOW
EGFR (Non-African Amer.): 32 — ABNORMAL LOW
Glucose: 71 mg/dL (ref 65–99)
Osmolality: 278 (ref 275–301)
Potassium: 4.5 mmol/L (ref 3.5–5.1)

## 2012-12-08 LAB — BASIC METABOLIC PANEL
Co2: 20 mmol/L — ABNORMAL LOW (ref 21–32)
EGFR (African American): 52 — ABNORMAL LOW
EGFR (Non-African Amer.): 45 — ABNORMAL LOW
Glucose: 75 mg/dL (ref 65–99)
Sodium: 141 mmol/L (ref 136–145)

## 2013-01-01 ENCOUNTER — Inpatient Hospital Stay: Payer: Self-pay | Admitting: Internal Medicine

## 2013-01-01 LAB — CBC WITH DIFFERENTIAL/PLATELET
Basophil #: 0.1 10*3/uL (ref 0.0–0.1)
Basophil %: 1 %
Eosinophil %: 2.9 %
HCT: 35 % (ref 35.0–47.0)
Lymphocyte #: 2.3 10*3/uL (ref 1.0–3.6)
Lymphocyte %: 38.4 %
MCH: 30.9 pg (ref 26.0–34.0)
MCHC: 34 g/dL (ref 32.0–36.0)
MCV: 91 fL (ref 80–100)
Monocyte #: 0.6 x10 3/mm (ref 0.2–0.9)
Neutrophil #: 2.9 10*3/uL (ref 1.4–6.5)
Platelet: 115 10*3/uL — ABNORMAL LOW (ref 150–440)
RBC: 3.85 10*6/uL (ref 3.80–5.20)

## 2013-01-01 LAB — URINALYSIS, COMPLETE
Bilirubin,UR: NEGATIVE
Blood: NEGATIVE
Glucose,UR: NEGATIVE mg/dL (ref 0–75)
Hyaline Cast: 8
Ph: 5 (ref 4.5–8.0)
Protein: NEGATIVE
RBC,UR: <1 /HPF (ref 0–5)
Specific Gravity: 1.013 (ref 1.003–1.030)
Squamous Epithelial: NONE SEEN
WBC UR: 1 /HPF (ref 0–5)

## 2013-01-01 LAB — BASIC METABOLIC PANEL
Anion Gap: 10 (ref 7–16)
Anion Gap: 13 (ref 7–16)
BUN: 54 mg/dL — ABNORMAL HIGH (ref 7–18)
Chloride: 100 mmol/L (ref 98–107)
Chloride: 105 mmol/L (ref 98–107)
Co2: 14 mmol/L — ABNORMAL LOW (ref 21–32)
Creatinine: 4.42 mg/dL — ABNORMAL HIGH (ref 0.60–1.30)
Creatinine: 3.65 mg/dL — ABNORMAL HIGH (ref 0.60–1.30)
EGFR (African American): 11 — ABNORMAL LOW
EGFR (African American): 14 — ABNORMAL LOW
EGFR (Non-African Amer.): 12 — ABNORMAL LOW
Osmolality: 277 (ref 275–301)
Osmolality: 286 (ref 275–301)
Sodium: 132 mmol/L — ABNORMAL LOW (ref 136–145)

## 2013-01-01 LAB — CK TOTAL AND CKMB (NOT AT ARMC)
CK, Total: 109 U/L (ref 21–215)
CK-MB: 1.1 ng/mL (ref 0.5–3.6)
CK-MB: 1.5 ng/mL (ref 0.5–3.6)

## 2013-01-01 LAB — MAGNESIUM: Magnesium: 2.6 mg/dL — ABNORMAL HIGH

## 2013-01-01 LAB — PROTIME-INR: Prothrombin Time: 14.8 secs — ABNORMAL HIGH (ref 11.5–14.7)

## 2013-01-02 ENCOUNTER — Ambulatory Visit: Payer: Self-pay | Admitting: Internal Medicine

## 2013-01-02 LAB — COMPREHENSIVE METABOLIC PANEL
Albumin: 3 g/dL — ABNORMAL LOW (ref 3.4–5.0)
Anion Gap: 9 (ref 7–16)
BUN: 40 mg/dL — ABNORMAL HIGH (ref 7–18)
Bilirubin,Total: 0.5 mg/dL (ref 0.2–1.0)
Calcium, Total: 7.5 mg/dL — ABNORMAL LOW (ref 8.5–10.1)
Co2: 17 mmol/L — ABNORMAL LOW (ref 21–32)
Creatinine: 3.11 mg/dL — ABNORMAL HIGH (ref 0.60–1.30)
EGFR (African American): 17 — ABNORMAL LOW
EGFR (Non-African Amer.): 14 — ABNORMAL LOW
Glucose: 181 mg/dL — ABNORMAL HIGH (ref 65–99)
Osmolality: 277 (ref 275–301)
Potassium: 3.5 mmol/L (ref 3.5–5.1)
SGOT(AST): 27 U/L (ref 15–37)
SGPT (ALT): 11 U/L — ABNORMAL LOW (ref 12–78)
Sodium: 131 mmol/L — ABNORMAL LOW (ref 136–145)
Total Protein: 6.6 g/dL (ref 6.4–8.2)

## 2013-01-02 LAB — CBC WITH DIFFERENTIAL/PLATELET
Eosinophil %: 0.3 %
Lymphocyte #: 1.8 10*3/uL (ref 1.0–3.6)
Lymphocyte %: 15.9 %
MCHC: 33.5 g/dL (ref 32.0–36.0)
Monocyte #: 1.5 x10 3/mm — ABNORMAL HIGH (ref 0.2–0.9)
Neutrophil %: 70.3 %
Platelet: 101 10*3/uL — ABNORMAL LOW (ref 150–440)
RDW: 14.9 % — ABNORMAL HIGH (ref 11.5–14.5)

## 2013-01-02 LAB — CK TOTAL AND CKMB (NOT AT ARMC)
CK, Total: 158 U/L (ref 21–215)
CK-MB: 2.4 ng/mL (ref 0.5–3.6)

## 2013-01-02 LAB — MAGNESIUM: Magnesium: 1.3 mg/dL — ABNORMAL LOW

## 2013-01-03 LAB — T4, FREE: Free Thyroxine: 1.1 ng/dL (ref 0.76–1.46)

## 2013-01-03 LAB — RENAL FUNCTION PANEL
Albumin: 2.4 g/dL — ABNORMAL LOW (ref 3.4–5.0)
Anion Gap: 9 (ref 7–16)
BUN: 16 mg/dL (ref 7–18)
Calcium, Total: 8 mg/dL — ABNORMAL LOW (ref 8.5–10.1)
Chloride: 112 mmol/L — ABNORMAL HIGH (ref 98–107)
Co2: 19 mmol/L — ABNORMAL LOW (ref 21–32)
Creatinine: 1.54 mg/dL — ABNORMAL HIGH (ref 0.60–1.30)
EGFR (Non-African Amer.): 34 — ABNORMAL LOW
Glucose: 104 mg/dL — ABNORMAL HIGH (ref 65–99)
Osmolality: 281 (ref 275–301)
Potassium: 3.7 mmol/L (ref 3.5–5.1)

## 2013-01-03 LAB — CBC WITH DIFFERENTIAL/PLATELET
Basophil %: 1.2 %
Eosinophil %: 5.7 %
HCT: 27.9 % — ABNORMAL LOW (ref 35.0–47.0)
HGB: 9.6 g/dL — ABNORMAL LOW (ref 12.0–16.0)
Monocyte #: 0.6 x10 3/mm (ref 0.2–0.9)
Monocyte %: 11.2 %
Neutrophil #: 2.4 10*3/uL (ref 1.4–6.5)
Platelet: 66 10*3/uL — ABNORMAL LOW (ref 150–440)
RBC: 3.11 10*6/uL — ABNORMAL LOW (ref 3.80–5.20)
RDW: 14.7 % — ABNORMAL HIGH (ref 11.5–14.5)
WBC: 5.3 10*3/uL (ref 3.6–11.0)

## 2013-01-03 LAB — TSH: Thyroid Stimulating Horm: 2.03 u[IU]/mL

## 2013-01-04 LAB — CBC WITH DIFFERENTIAL/PLATELET
Basophil #: 0.1 10*3/uL (ref 0.0–0.1)
Eosinophil #: 0.4 10*3/uL (ref 0.0–0.7)
Lymphocyte #: 2 10*3/uL (ref 1.0–3.6)
Lymphocyte %: 38.8 %
MCH: 31.1 pg (ref 26.0–34.0)
MCHC: 34.6 g/dL (ref 32.0–36.0)
MCV: 90 fL (ref 80–100)
Neutrophil #: 2 10*3/uL (ref 1.4–6.5)
Platelet: 71 10*3/uL — ABNORMAL LOW (ref 150–440)
RBC: 2.9 10*6/uL — ABNORMAL LOW (ref 3.80–5.20)
RDW: 15.3 % — ABNORMAL HIGH (ref 11.5–14.5)

## 2013-01-04 LAB — URINE CULTURE

## 2013-01-04 LAB — BASIC METABOLIC PANEL
Anion Gap: 9 (ref 7–16)
BUN: 9 mg/dL (ref 7–18)
Calcium, Total: 7.8 mg/dL — ABNORMAL LOW (ref 8.5–10.1)
Co2: 19 mmol/L — ABNORMAL LOW (ref 21–32)
Creatinine: 1.14 mg/dL (ref 0.60–1.30)
EGFR (Non-African Amer.): 48 — ABNORMAL LOW
Glucose: 84 mg/dL (ref 65–99)
Osmolality: 279 (ref 275–301)
Potassium: 3.6 mmol/L (ref 3.5–5.1)
Sodium: 141 mmol/L (ref 136–145)

## 2013-01-05 LAB — CBC WITH DIFFERENTIAL/PLATELET
Basophil #: 0 10*3/uL (ref 0.0–0.1)
Eosinophil %: 5.5 %
HCT: 24.7 % — ABNORMAL LOW (ref 35.0–47.0)
Lymphocyte #: 1.9 10*3/uL (ref 1.0–3.6)
Lymphocyte %: 33.7 %
MCHC: 35.2 g/dL (ref 32.0–36.0)
MCV: 90 fL (ref 80–100)
Monocyte %: 13.7 %
Platelet: 77 10*3/uL — ABNORMAL LOW (ref 150–440)
RBC: 2.76 10*6/uL — ABNORMAL LOW (ref 3.80–5.20)
WBC: 5.7 10*3/uL (ref 3.6–11.0)

## 2013-01-05 LAB — BASIC METABOLIC PANEL
Anion Gap: 9 (ref 7–16)
BUN: 5 mg/dL — ABNORMAL LOW (ref 7–18)
Calcium, Total: 7.8 mg/dL — ABNORMAL LOW (ref 8.5–10.1)
Co2: 21 mmol/L (ref 21–32)
Creatinine: 1.07 mg/dL (ref 0.60–1.30)
EGFR (African American): >60
EGFR (Non-African Amer.): 52 — ABNORMAL LOW
Glucose: 84 mg/dL (ref 65–99)
Osmolality: 278 (ref 275–301)
Potassium: 3.3 mmol/L — ABNORMAL LOW (ref 3.5–5.1)

## 2013-01-06 LAB — CULTURE, BLOOD (SINGLE)

## 2013-02-02 ENCOUNTER — Ambulatory Visit: Payer: Self-pay | Admitting: Internal Medicine

## 2013-03-05 DEATH — deceased

## 2014-04-30 IMAGING — US US RENAL KIDNEY
1 series · 14 of 25 positions shown · non-contrast
Comparison: none

REASON FOR EXAM: ARF
COMMENTS:

[Series 1: us renal kidney · 0.26mm/px · 14 of 39 slices shown]
[im 1/39]
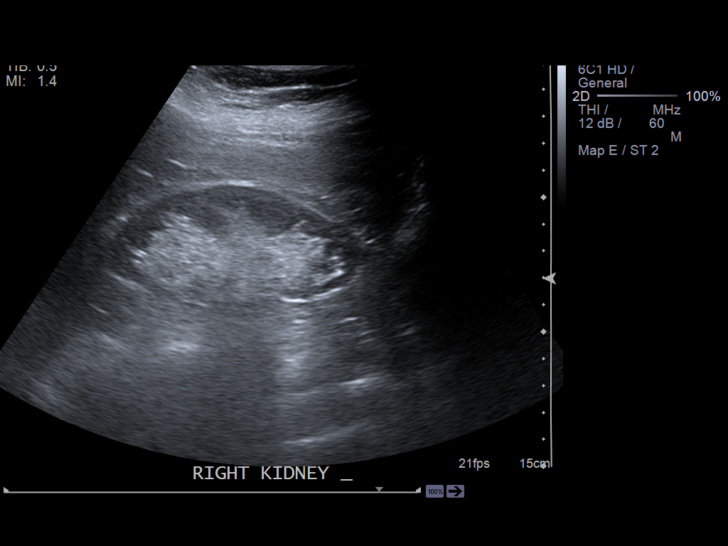
[im 4/39]
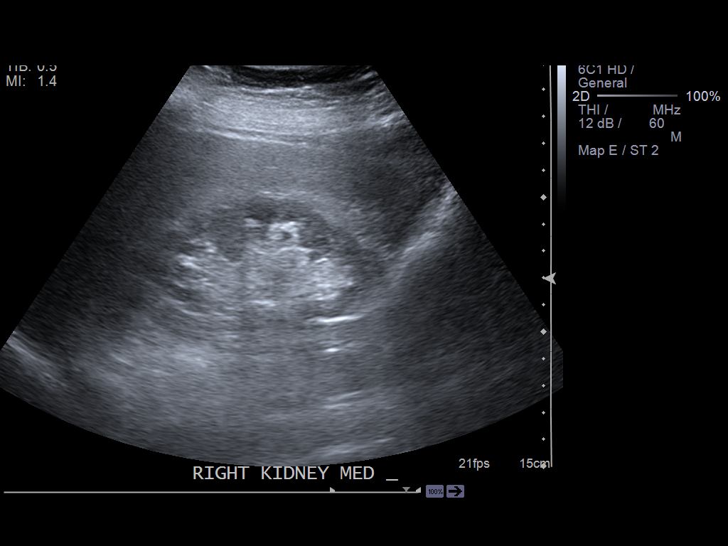
[im 7/39]
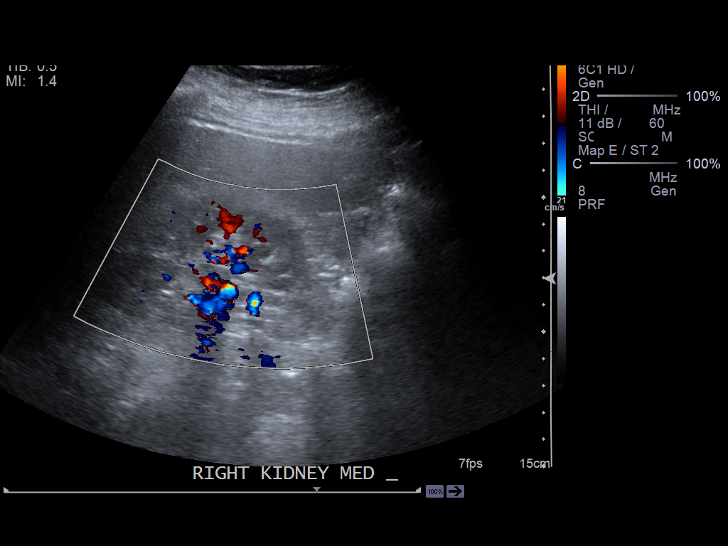
[im 10/39]
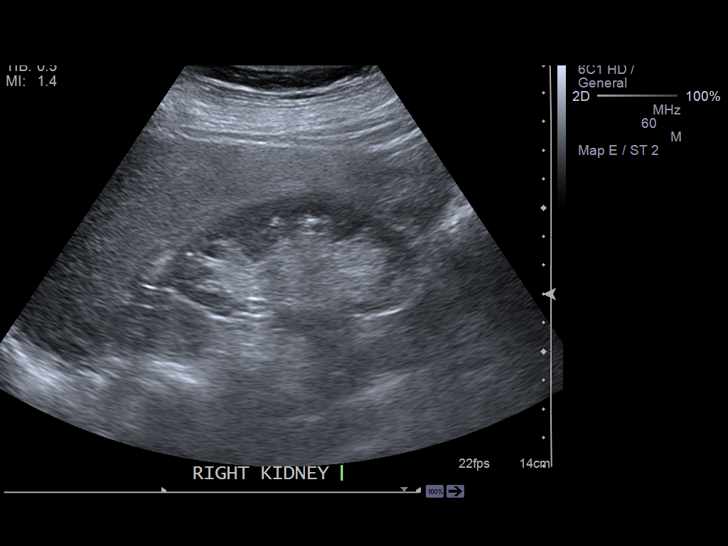
[im 13/39]
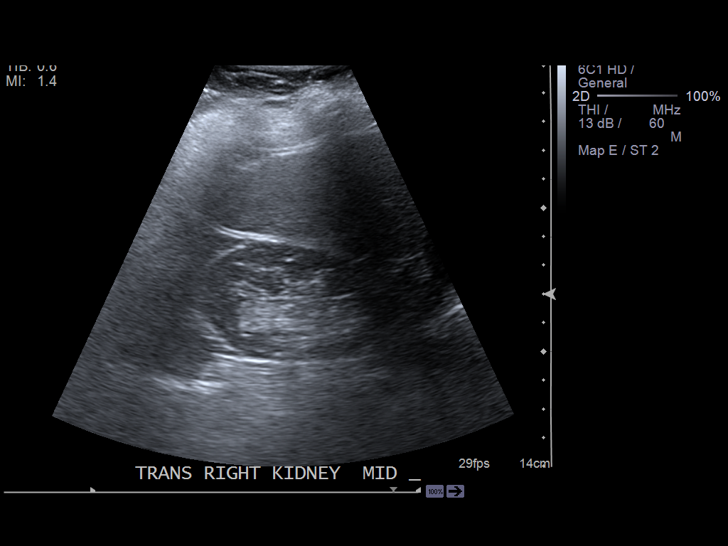
[im 15/39]
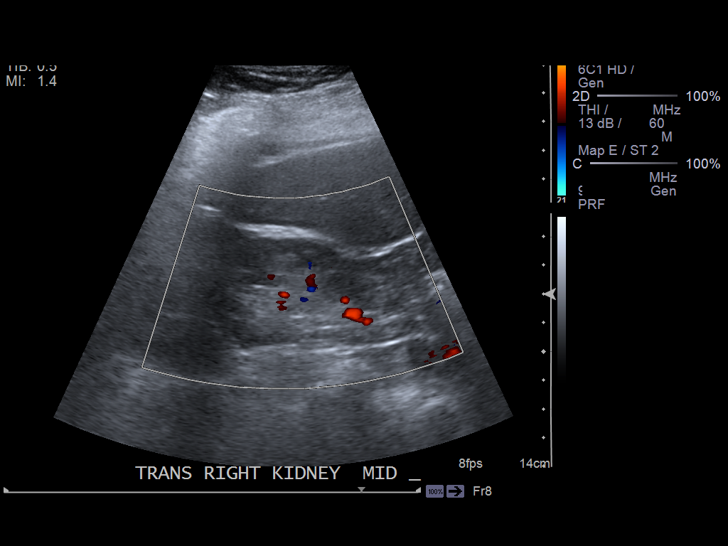
[im 18/39]
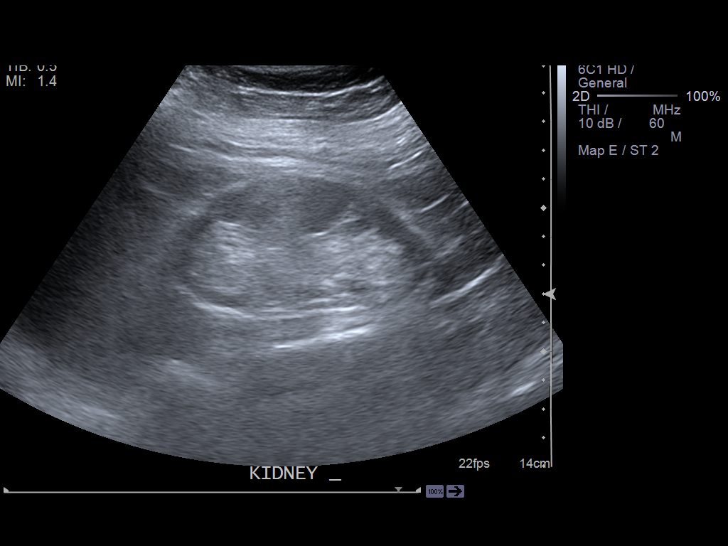
[im 21/39]
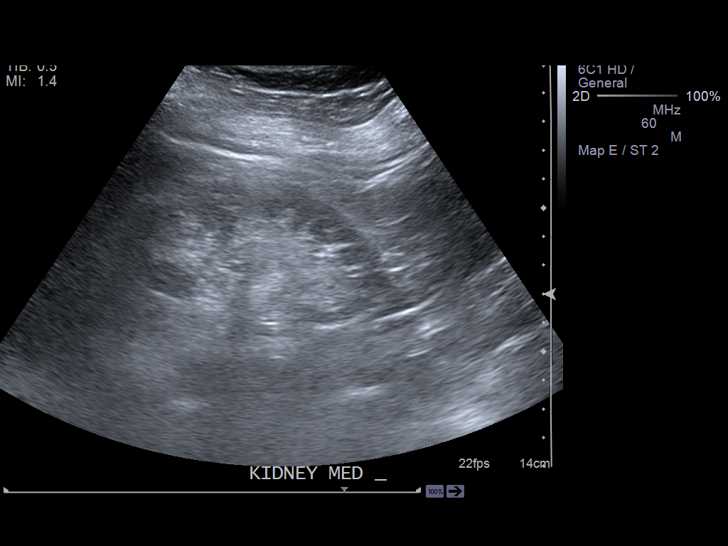
[im 24/39]
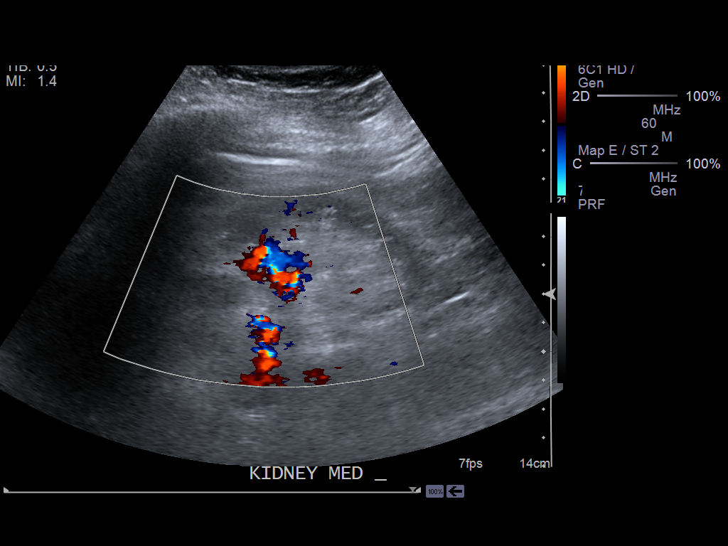
[im 26/39]
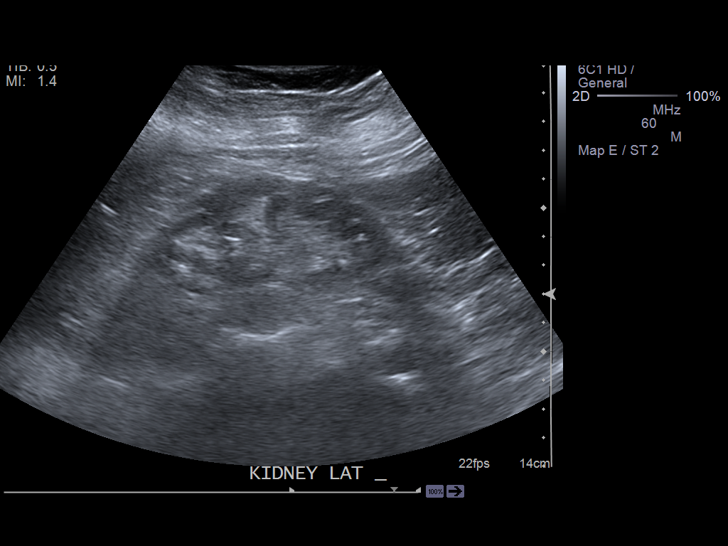
[im 29/39]
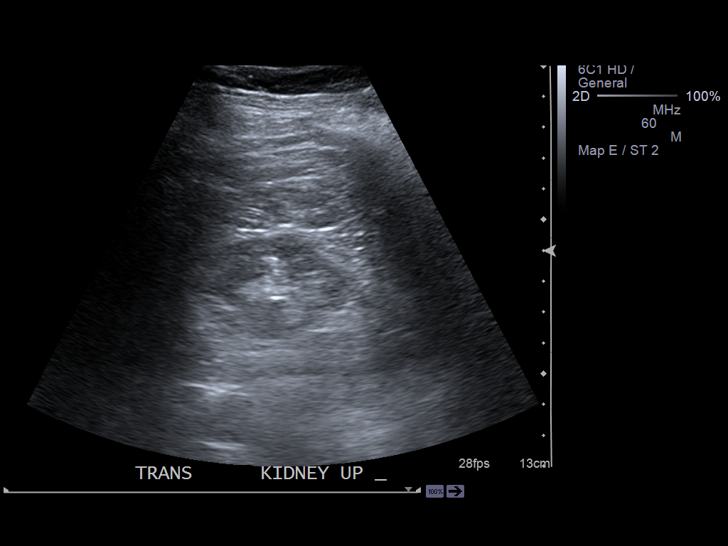
[im 32/39]
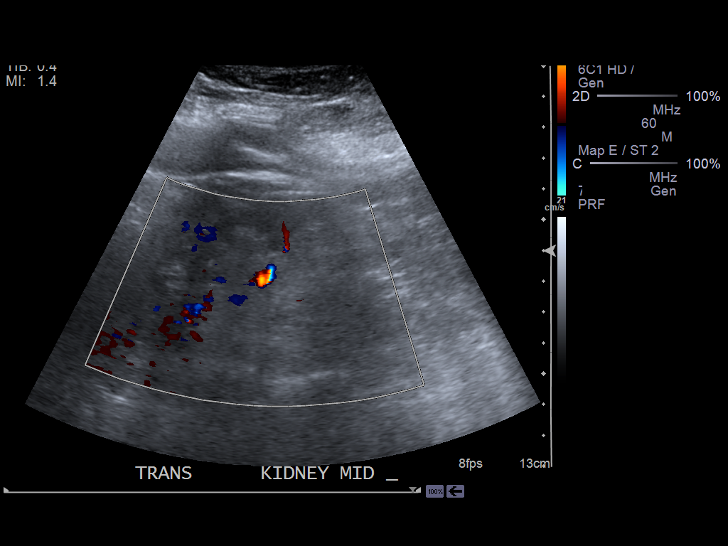
[im 35/39]
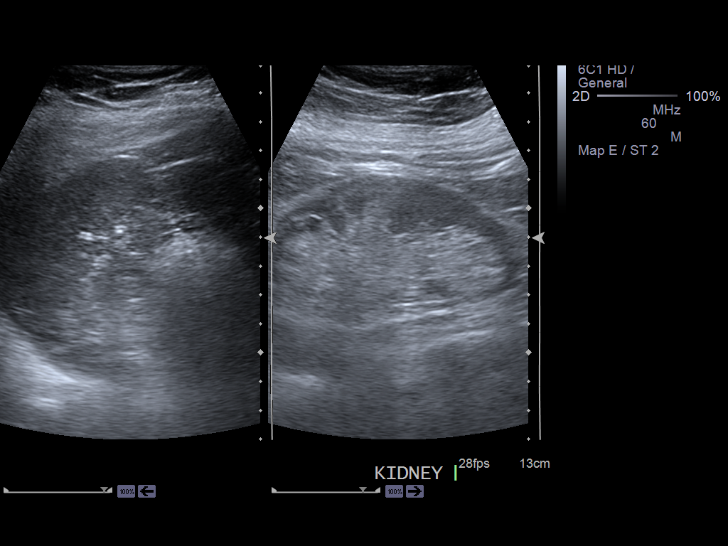
[im 39/39]
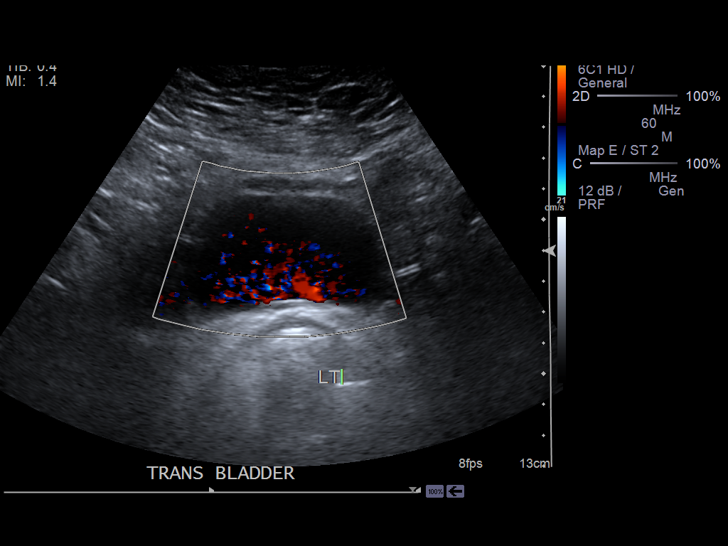

[14 of 25 positions shown; findings below may reference images not displayed]

PROCEDURE:     US  - US KIDNEY  - September 17, 2012  [DATE]

RESULT:     Renal ultrasound is performed in the standard fashion. The right
kidney measures 9.68 x 4.27 x 3.57 cm. The left kidney measures 9.18 x
x 4.13 cm. Ureteral jets are demonstrated at the bladder bilaterally with
color Doppler images. Neither kidney shows evidence of a solid or cystic
mass. No definite stones are identified. There is poor visualization of
portions of the left kidney. Both kidneys show mild cortical thinning.
IMPRESSION: 1. No hydronephrosis evident. No focal mass demonstrated. Bilateral mild
cortical thinning is present consistent with chronic renal disease.

[REDACTED]

## 2014-05-01 IMAGING — CR DG CHEST 2V
1 series · 3 of 3 positions shown · non-contrast
Comparison: none

REASON FOR EXAM: chest pain, shortness of breath
COMMENTS:

[Series 4: w chest pa · 0.14mm/px · 3 of 3 slices shown]
[im 1/3]
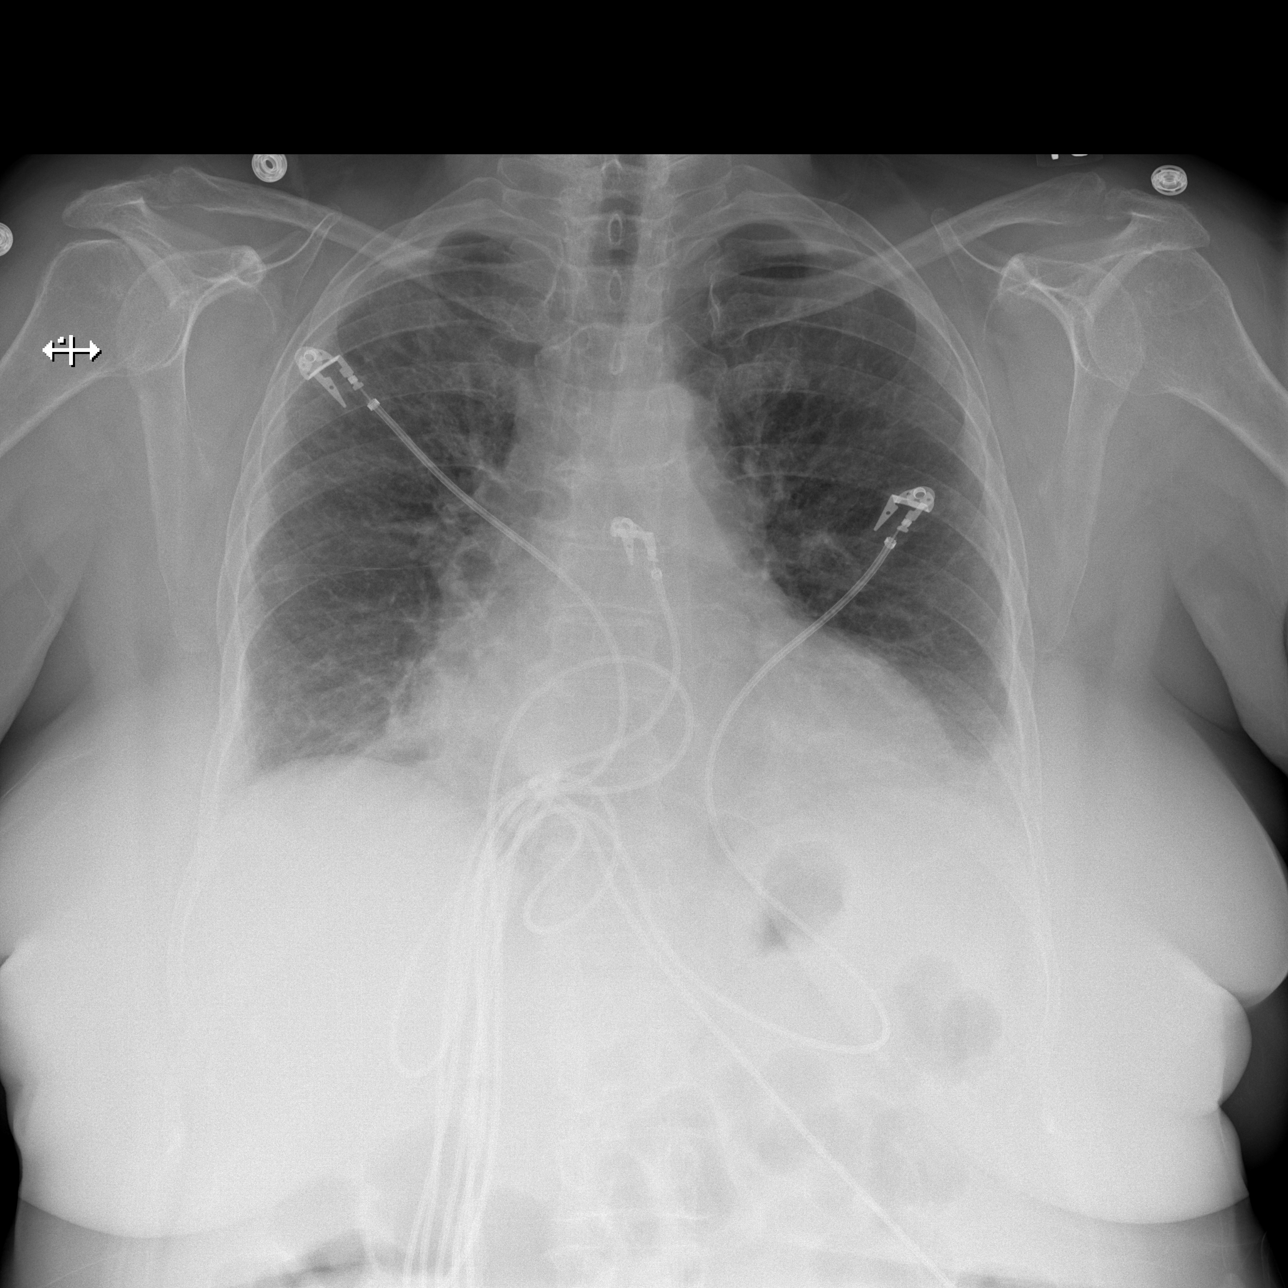
[im 2/3]
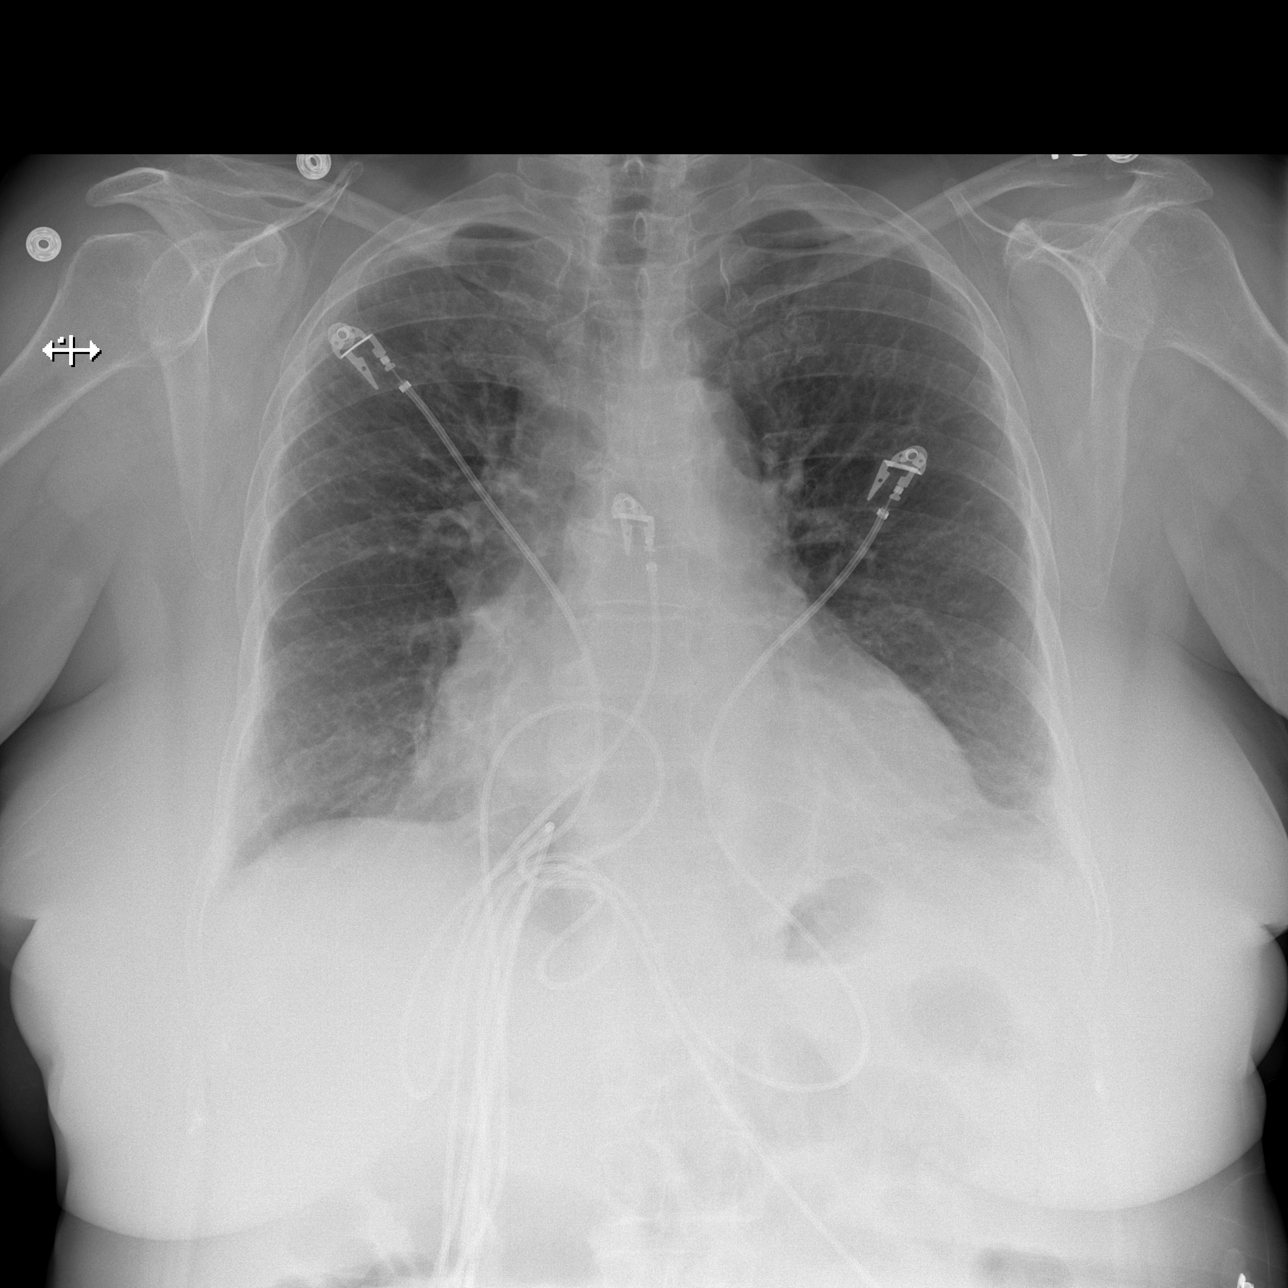
[im 3/3]
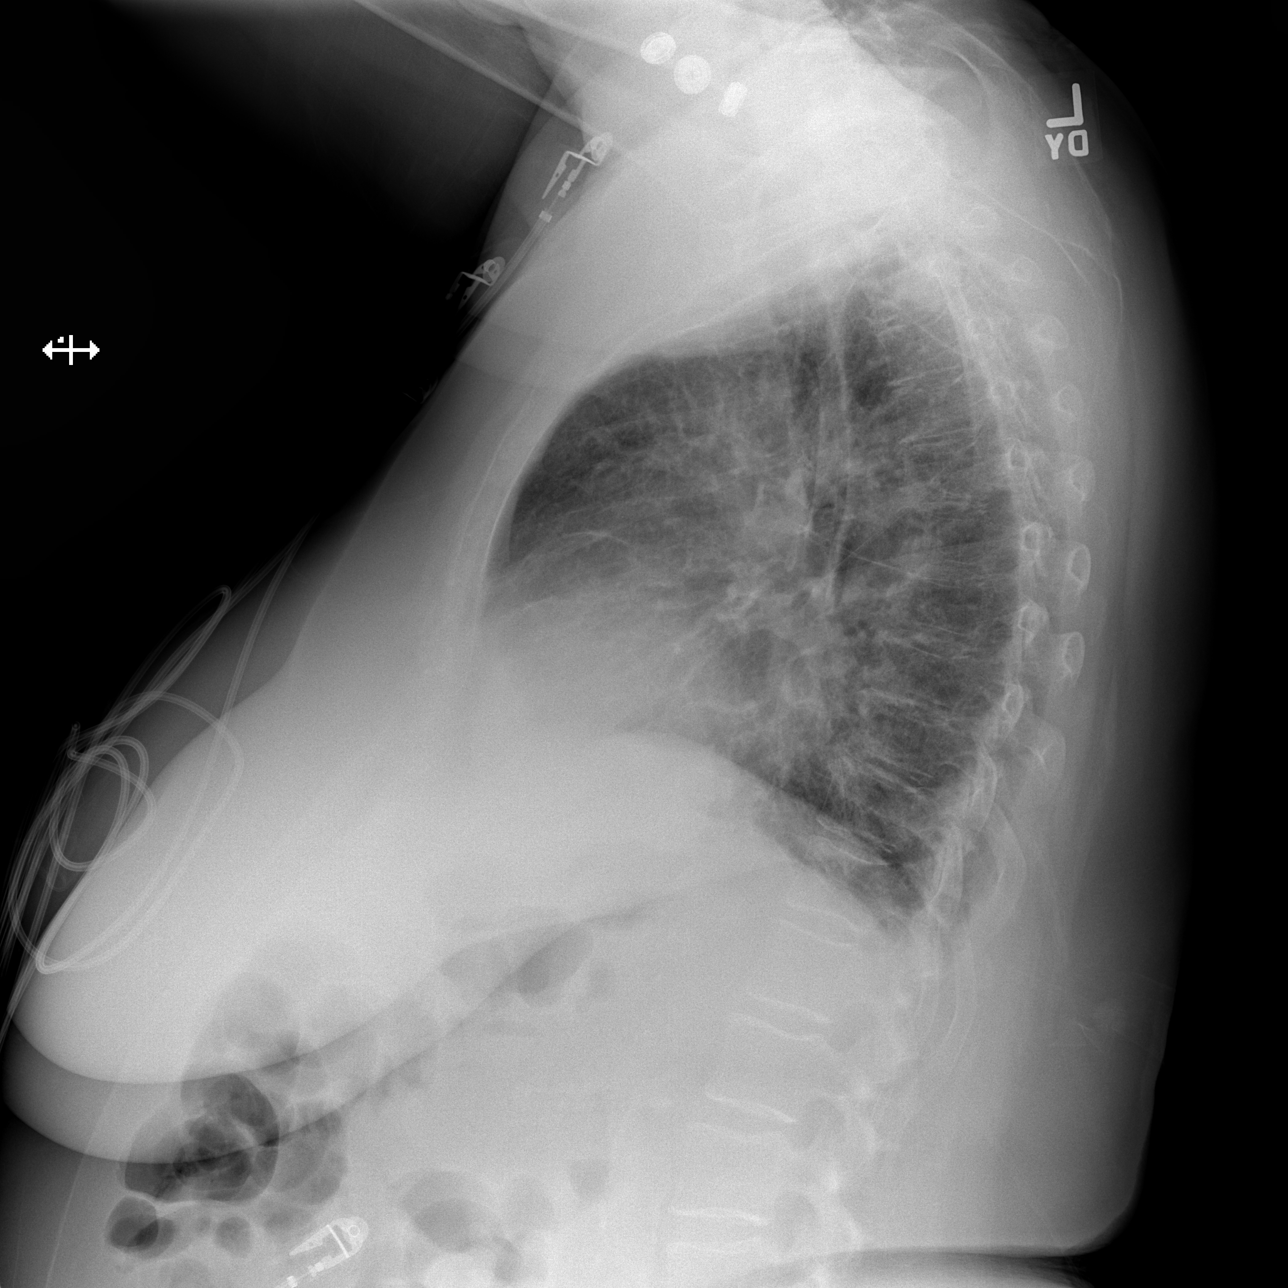

[3 of 3 positions shown; findings below may reference images not displayed]

PROCEDURE:     DXR - DXR CHEST PA (OR AP) AND LATERAL  - September 18, 2012  [DATE]

RESULT:     Comparison is made to the previous study of 09/16/2012. Cardiac
monitoring electrodes are present. Cardiac silhouette is enlarged.
Interstitial markings are prominent which could represent pneumonitis or
edema. Lung base atelectasis appears present on the left. No significant
effusion is seen.
IMPRESSION: Cardiomegaly with mild edema. Possible trace effusions.
Minimal left lung base atelectasis.

[REDACTED]

## 2014-05-25 NOTE — Discharge Summary (Signed)
PATIENT NAME:  Mallory Copeland, Mallory Copeland MR#:  [id number] DATE OF BIRTH:  04-08-1940  DATE OF ADMISSION:  10/12/2012 DATE OF DISCHARGE:  10/17/2012  ADMITTING PHYSICIAN: Starleen Armsawood Disha Cottam. Elgergawy, MD  DISCHARGING PHYSICIAN: Silas FloodSheikh A. Tejan-Sie, MD  DISCHARGE DIAGNOSES: Acute kidney injury, dehydration, dementia, severe constipation, bladder outflow obstruction with urinary retention.   DISCHARGE MEDICATIONS: Plavix 75 mg daily, Lipitor 40 mg daily, gabapentin 300 mg t.i.d., Nitrostat 0.4 mg sublingual p.r.n. q.5, Namenda 10 mg b.i.d. pantoprazole 40 mg b.i.d., senna 1 tab b.i.d.   HOSPITAL COURSE: This lady was admitted through the Emergency Room where she presented with general malaise, nausea, reduced oral intake and clinical features consistent with acute renal failure with creatinine acutely elevated to 3, severe hyponatremia of 117, and bladder scan revealed 605 mL postresidual volume. The patient also reported severe constipation. Please refer to the history and physical for details. She was admitted to medical floor where she was treated with intravenous normal saline to correct her salt and water volume deficit and her renal failure. Foley catheter was inserted to relieve her urinary retention. She was started on laxatives and also received an enema to promote proper bowel movements. The patient'Mallory Copeland creatinine improved and gradually returned to her baseline of 1.27. Her sodium was 1035 on the day of discharge. White cell count remained normal throughout her stay. She did not require antibiotics for any presumed or suspected infections. The patient was fully ambulatory throughout her stay and was good to return home. She was discharged to home with home health.   DIET: Regular with normal consistency.   ACTIVITY LIMITATIONS: None.  FOLLOWUP: In 1 to 2 weeks with Dr. Ellsworth Lennoxejan-Sie.  IMAGING: KUB abdominal film showed nonobstructive bowel gas pattern with large/much stool in the bowel. Chest x-ray showed atelectasis  in the region of the lingula.   DISCHARGE PROCESS TIME SPENT: 32 minutes.   ____________________________ Silas FloodSheikh A. Ellsworth Lennoxejan-Sie, MD sat:jm D: 11/05/2012 15:40:54 ET T: 11/05/2012 16:37:42 ET JOB#: [id number]  cc: Sheikh A. Ellsworth Lennoxejan-Sie, MD, <Dictator> Charlesetta GaribaldiSHEIKH A TEJAN-SIE MD ELECTRONICALLY SIGNED 11/09/2012 14:05

## 2014-05-25 NOTE — H&P (Signed)
PATIENT NAME:  Mallory Copeland, [name] MR#:  [id number] DATE OF BIRTH:  1940/05/10  DATE OF ADMISSION:  10/13/2012  REFERRING PHYSICIAN: Bayard Malesandolph Brown, MD   PRIMARY CARE PHYSICIAN: Dr. Ellsworth Lennoxejan-Sie   CHIEF COMPLAINT: Weakness, feeling sick.   HISTORY OF PRESENT ILLNESS: This is a 74 year old female with significant past medical history of Alzheimer's dementia, CVA, hyperlipidemia, chronic obstructive pulmonary disease, coronary artery disease, presents with complaints of "I feel sick." Reports her symptoms have been going on for the last day a few days. As well, she complains of nausea, but she denies any chest pain, any shortness of breath, any vomiting, any diarrhea. She does complain of vomiting for the last 10 days. She complains of constipation with no bowel movement for the last 10 days. The patient had basic work-up done in the ED including a CBC, BMP.  Her labs came back significant for acute renal failure with normal creatinine at baseline, it was found to be at 3 today, as well she was found to have hyponatremia at 117, where her baseline runs around 128 to 130  during most recent admission last month. The patient urinalysis came back negative for the infection. The patient is known to have history of Alzheimer's dementia, extremely poor historian . They report she has not been eating or drinking well for the last 4 to 5 days. She was recently seen by her PCP where she was started on new medicine taking twice a day. She cannot recall the medicine name at this point. The patient had bladder scan done in ED which did show postvoid residual volume 675. She has had some mild complaints of mild abdominal pain. Hospitalist service was requested to admit the patient for further work-up of her acute renal failure and hyponatremia. The patient received a total of 2 liters of IV normal saline in the ED. The patient denies any focal deficits, any new tingling or numbness.   PAST MEDICAL HISTORY: 1.  Alzheimer's  dementia.  2.  Chronic obstructive pulmonary disease.  3.  Coronary artery disease.  4.  GERD.    5.  Hypertension.  6.  Left CVA.  7.  Hyperlipidemia.  8.  Obesity.   SOCIAL HISTORY: The patient used to smoke in the past, quit many years ago, currently lives at home with her husband. No alcohol use.   FAMILY HISTORY: Significant for diabetes in her mother.   ALLERGIES: No known drug allergies.   HOME MEDICATIONS: 1.  Protonix 40 mg b.i.d.  2.  Sublingual nitroglycerin as needed.  3.  Gabapentin 300 mg oral 3 times a day.  4.  Plavix 75 mg daily.  5.  Atorvastatin 40 mg oral daily.  6.  There is new medication started by her PCP, which we do not know yet, which the family can just call in the name.    REVIEW OF SYSTEMS:  CONSISTUTIONAL:  The patient has history of Alzheimer's dementia and I tried to obtain it the best I could.   GENERAL: The patient denies fever, chills. Complains of fatigue, weakness. No weight gain, weight loss.  EYES: Denies blurry vision, double vision, or eye pain, redness,  ENT: Denies tinnitus, ear pain, hearing loss, nasal discharge, sore throat.  RESPIRATORY: Denies cough, wheezing, hemoptysis.  CARDIOVASCULAR: Denies chest pain. No edema. No palpitation. No orthopnea. No edema.  GASTROINTESTINAL: No vomiting, no diarrhea, no melena, but complains of constipation. No bowel movement for the last 10 days and mild nausea.  GENITOURINARY:  Denies dysuria,  hematuria or renal colic, but family reports they noticed some urinary incontinence over the last few days.  ENDOCRINE: Denies polyuria, polydipsia, heat or cold intolerance.  HEMATOLOGY: Denies easy bruising or easy bleeding.  INTEGUMENTARY: Denies acne, rash or skin lesions.  MUSCULOSKELETAL: Denies any joint pain or cramps or redness.  NEUROLOGIC: Denies any focal numbness, weakness, dysarthria, or seizures. Complains of mild lightheadedness.  PSYCHIATRIC: Denies anxiety, depression, substance or alcohol  abuse.   PHYSICAL EXAMINATION: VITAL SIGNS: Temperature 97.5, pulse 54, respiratory rate 20, blood pressure 111/55, saturating 95% on room air.  GENERAL: Elderly female who looks comfortable in bed, in no apparent distress.  HEENT: Head normocephalic. Pupils are equal and reactive to light. Pink conjunctivae. Anicteric sclerae. Dry oral mucosa.  NECK: Supple. No thyromegaly. No JVD.  CHEST: Good air entry bilaterally. No wheezing, rales, rhonchi.  CARDIOVASCULAR: S1, S2 heard. No rubs, murmur or gallops.  ABDOMEN: Obese, soft, nontender, nondistended. Bowel sounds present had some mild tenderness around suprapubic area due to distended bladder.  EXTREMITIES: No edema. No clubbing. No cyanosis. Dorsalis pedis pulse +2 bilaterally.  SKIN: Warm and dry no rash.  PSYCHIATRIC: The patient is awake, alert x 1 pleasant, but confused.  NEUROLOGIC: Cranial nerves grossly intact. Motor strength grossly intact, sensation intact to light touch bilaterally.   PERTINENT LABORATORY DATA: BNP 191, glucose 116, BUN 51, creatinine 3.05, sodium 117. Potassium 3.5, chloride 8.2, CO2 25, troponin less than is 0.02, ALT 25, AST 48, alkaline phosphatase 86. White blood cells 7.7, hemoglobin 12.1, hematocrit 34.5, platelet 217. Urinalysis negative for leukocyte esterase and nitrites.   ASSESSMENT AND PLAN: 1.  Acute renal failure. Appears to be multifactorial, mainly due to volume depletion as she has decreased oral intake over the last few days as well due to urinary retention. The patient had recent ultrasound which shows some cortical thinning.  There is  a baseline on chronic renal disease as well. We will continue with aggressive hydration. We will insert Foley catheter and we will consult nephrology service. We will hold nephrotoxic medication.  2.  Hyponatremia. As well this is most likely due to her dehydration and volume depletion and renal failure. As well, she was recently started on medication by her PCP. At  this point, it is unclear if which one. I am not sure if this is contributing after her hyponatremia or not. The patient has no neurologic symptoms due to her hyponatremia, so will avoid hypertonic saline  at this point, and we will continue to replete her   volume with IV normal saline. She already received 2 liters in the ED. We will keep her on  150 mL per hour. We will monitor her sodium closely to avoid rapid correction.  3.  Urinary retention will insert Foley catheter.  4.  Constipation. The patient has not had a bowel movement for the last 10 days. We will start her on an aggressive laxative regimen.  5.  Dementia. Continue with Namenda.  6.  Hypertension, acceptable off medications.  7.  Hyperlipidemia. Continue with statin.  8.  History of coronary artery disease and cerebrovascular accident.  Continue with Plavix and statin and no beta blocker at this point, due to her bradycardia.  9.  Deep vein thrombosis prophylaxis. Subcutaneous heparin.   CODE STATUS: Discussed with the husband and son. She has no LIVING WILL. Her husband is her healthcare power of attorney and she is a full code.   TOTAL TIME SPENT ON ADMISSION AND PATIENT CARE:  55 minutes.    ____________________________ Starleen Arms, MD dse:cc D: 10/13/2012 00:14:50 ET T: 10/13/2012 00:35:50 ET JOB#: [id number]  cc: Starleen Arms, MD, <Dictator> Izaiah Tabb Teena Irani MD ELECTRONICALLY SIGNED 10/14/2012 3:12

## 2014-05-25 NOTE — Consult Note (Signed)
General Aspect 74 year old female with history of CAD, CVA, hypertension, hyperlipidemia, ex-smoker with COPD and with Alzheimer's.  Dr. Humphrey Rolls is her regular cardiologist but while he is on vacation have been asked to see her in consult for chest pain that occurred this a.m.,now pain free after 2-3 nitroglycerin sublingually.  Patient is a poor historian due to her Alzheimer's and her husband filled in some of the history and the rest is obtained from the H&P.  She is being admitted for further workup.  Her cardiac enzymes and negative EKG is negative for acute changes.  She does have bradycardia on the monitor, heart rate in the upper 50s, but is on beta blockers. BNP mildly elevated and chest x-ray suggests mild congestive heart failure.   Physical Exam:  GEN well developed, no acute distress   HEENT pink conjunctivae   RESP normal resp effort  clear BS   CARD Regular rate and rhythm  Bradycardic  No murmur   ABD denies tenderness  normal BS   EXTR negative edema   SKIN normal to palpation, skin turgor decreased   NEURO cranial nerves intact, motor/sensory function intact   PSYCH alert, poor insight, memory issues   Review of Systems:  Subjective/Chief Complaint Chest pain   Cardiovascular: Chest pain or discomfort  this a.m. x1 episode   Review of Systems: All other systems were reviewed and found to be negative   ROS Pt not able to provide ROS  poor memory   Medications/Allergies Reviewed Medications/Allergies reviewed   Lab Results: Hepatic:  07-Aug-14 10:41   Bilirubin, Total 0.4  Alkaline Phosphatase 86  SGPT (ALT) 26  SGOT (AST) 31  Total Protein, Serum 7.9  Albumin, Serum 3.6  Routine Chem:  07-Aug-14 10:41   B-Type Natriuretic Peptide ([facility])  217 (Result(s) reported on 08 Sep 2012 at 11:16AM.)  Glucose, Serum  140  BUN  24  Creatinine (comp)  1.57  Sodium, Serum  129  Potassium, Serum 4.3  Chloride, Serum  96  CO2, Serum 29  Calcium (Total), Serum 8.8   Osmolality (calc) 265  eGFR (African American)  38  eGFR (Non-African American)  33 (eGFR values <63m/min/1.73 m2 may be an indication of chronic kidney disease (CKD). Calculated eGFR is useful in patients with stable renal function. The eGFR calculation will not be reliable in acutely ill patients when serum creatinine is changing rapidly. It is not useful in  patients on dialysis. The eGFR calculation may not be applicable to patients at the low and high extremes of body sizes, pregnant women, and vegetarians.)  Anion Gap  4  Cardiac:  07-Aug-14 10:41   CK, Total 103  CPK-MB, Serum  < 0.5 (Result(s) reported on 08 Sep 2012 at 11:16AM.)  Troponin I < 0.02 (0.00-0.05 0.05 ng/mL or less: NEGATIVE  Repeat testing in 3-6 hrs  if clinically indicated. >0.05 ng/mL: POTENTIAL  MYOCARDIAL INJURY. Repeat  testing in 3-6 hrs if  clinically indicated. NOTE: An increase or decrease  of 30% or more on serial  testing suggests a  clinically important change)  Routine UA:  07-Aug-14 10:41   Color (UA) Amber  Clarity (UA) Clear  Glucose (UA) Negative  Bilirubin (UA) Negative  Ketones (UA) Negative  Specific Gravity (UA) 1.008  Blood (UA) Negative  pH (UA) 5.0  Protein (UA) Negative  Nitrite (UA) Positive  Leukocyte Esterase (UA) Negative (Result(s) reported on 08 Sep 2012 at 11:32AM.)  RBC (UA) 2 /HPF  WBC (UA) 1 /HPF  Bacteria (  UA) 2+  Epithelial Cells (UA) 6 /HPF  Mucous (UA) PRESENT  Hyaline Cast (UA) 21 /LPF (Result(s) reported on 08 Sep 2012 at 11:32AM.)  Routine Hem:  07-Aug-14 10:41   WBC (CBC) 6.6  RBC (CBC)  3.77  Hemoglobin (CBC)  11.4  Hematocrit (CBC)  32.4  Platelet Count (CBC) 236 (Result(s) reported on 08 Sep 2012 at 11:10AM.)  MCV 86  MCH 30.3  MCHC 35.2  RDW 13.7    Penicillin: Unknown  Codeine: Unknown  Aspirin: Unknown  Tape: Unknown  Sulfa drugs: Unknown  Latex: Unknown  Vital Signs/Nurse's Notes: **Vital Signs.:   07-Aug-14 16:30  Vital  Signs Type Admission  Celsius 36.5  Pulse Pulse 53  Respirations Respirations 18  Systolic BP Systolic BP 753  Diastolic BP (mmHg) Diastolic BP (mmHg) 71  Mean BP 93  Pulse Ox % Pulse Ox % 94  Oxygen Delivery Room Air/ 21 %    Impression 74 year old female with history of CAD, CVA, hypertension hyperlipidemia, COPD, renal insufficiency and also chest pain now relieved with nitroglycerin sublingually.   Plan 1.  Surface echocardiogram pending. 2.  Lexi Myoview to be done in a.m.. 3.  Agree with holding  blockers due to bradycardia. 4.  Continue with aspirin Plavix statin with history of CAD, CVA. 5.  Serial enzymes 6.  Further recommendation per Dr. Nehemiah Massed after results of  tests are known.  Patient seen in collaboration Dr. Nehemiah Massed.   Electronic Signatures: Roderic Palau (NP)  (Signed 07-Aug-14 17:55)  Authored: General Aspect/Present Illness, History and Physical Exam, Review of System, Labs, Allergies, Vital Signs/Nurse's Notes, Impression/Plan   Last Updated: 07-Aug-14 17:55 by Roderic Palau (NP)

## 2014-05-25 NOTE — Discharge Summary (Signed)
PATIENT NAME:  Mallory Copeland, Mallory Copeland MR#:  [id number] DATE OF BIRTH:  03-29-1940  DATE OF ADMISSION:  09/17/2012 DATE OF DISCHARGE:  09/18/2012  PRIMARY CARE PHYSICIAN: Dr. Ellsworth Lennoxejan-Sie.    PRIMARY CARDIOLOGIST: Dr. Welton FlakesKhan.   DISCHARGE DIAGNOSES:  1.  Chest pain, likely secondary from gastroesophageal reflux disease.  2.  Hypertension.  3.  Coronary artery disease.   CONSULTANTS: Dr. Lady GaryFath of cardiology.   IMAGING STUDIES DONE: Include a chest x-ray, which showed no acute abnormalities. Lung V/Q scan was low probability for PE.   ADMITTING HISTORY AND PHYSICAL:  1.  Please see detailed H and P dictated previously. In brief, this is a 74 year old female patient with history of coronary artery disease and recent admission to the hospital for chest pain with negative stress test. She returned to Emergency Room complaining of chest pain. The patient was admitted after the ER physician was concerned regarding acute coronary syndrome. A d-dimer was checked in the ER, which was elevated.  2.  Chest pain. The patient had noncardiac chest pain with tenderness. Secondary to elevated d-dimer and elevated creatinine, the patient had a V/Q scan done, which was low probability for PE. She did not have any hypoxia or tachycardia. No reason for prolonged hospital stay, surgeries or longcar/flight journeys. The patient did not get any anticoagulation in the hospital other than the use of subcutaneous heparin for DVT prophylaxis.  3.  The patient was seen by Dr. Lady GaryFath of cardiology, who did not feel this was cardiac with normal cardiac enzymes and recent negative stress test. The patient is being discharged back home with twice a day PPIs to followup with GI for an endoscopy.   DISCHARGE MEDICATIONS: Include:  1.  Protonix 40 mg oral 2 times a day.  2.  Aspirin 81 mg oral once a day.  3.  Plavix 75 mg oral once a day.  4.  Atorvastatin 40 mg oral once a day.  5.  Gabapentin 300 mg oral 3 times a day as needed for pain.   6.  Nitrostat 0.4 sublingual as needed for chest pain.  7.  Namenda 10 mg oral 2 times a day.  8.  Colace 100 mg oral 2 times a day as needed for constipation.  9.  MiraLax 17 grams oral once a day as needed for constipation.   DISCHARGE INSTRUCTIONS: Low-sodium, regular consistency diet. Activity as tolerated. Follow up with Dr. Niel HummerIftikhar of GI in 1 to 2 weeks for an outpatient endoscopy. The patient is also to follow up with her primary care physician Dr. Ellsworth Lennoxejan-Sie in 1 to 2 weeks.   Time spent on day of discharge in discharge activity was 35 minutes.  ____________________________ Molinda BailiffSrikar R. Latandra Loureiro, MD srs:aw D: 09/19/2012 16:17:08 ET T: 09/20/2012 07:14:54 ET JOB#: [id number]  cc: Wardell HeathSrikar R. Ndrew Creason, MD, <Dictator> Sheikh A. Ellsworth Lennoxejan-Sie, MD Lurline DelShaukat Iftikhar, MD Laurier NancyShaukat A. Khan, MD Orie FishermanSRIKAR R Natanel Snavely MD ELECTRONICALLY SIGNED 09/23/2012 0:44

## 2014-05-25 NOTE — H&P (Signed)
PATIENT NAME:  Mallory Copeland, Mallory Copeland MR#:  [id number] DATE OF BIRTH:  [birth year]  DATE OF ADMISSION:  09/17/2012  PRIMARY CARE PHYSICIAN: Mallory Copeland A. Ellsworth Lennoxejan-Sie, MD  PRIMARY CARDIOLOGIST: Mallory NancyShaukat A. Khan, MD  CHIEF COMPLAINT: Chest pain.   HISTORY OF PRESENTING ILLNESS: A 74 year old Caucasian female patient who was recently in the hospital for chest pain, had a negative stress test in hospitall, who comes back to the Emergency Room complaining of similar chest pain. She mentions that this is a heavy chest pressure in the central chest on and off, brought on with exertion, like walking around and playing with her grandkids. Presently, is chest pain-free. This is associated with some shortness of breath and nausea, but no palpitations, lightheadedness or syncope.   The patient had a stress test for similar pain, was seen by Dr. Gwen Copeland and discharged home the last time.   This has no relation to food. The patient does have possible esophageal motility disorder, as per old EGD reports.   She has been in a normal sinus rhythm, not tachycardic, not hypoxic with short recent hospital stay, but no recent surgeries, long flight or car journeys. Low risk for PE, but D-dimer was checked in the Emergency Room, which has been positive. Creatinine is elevated secondary to acute renal failure, and the patient cannot have a CTA of chest.   REVIEW OF SYSTEMS:  CONSTITUTIONAL: No fever, fatigue, weakness.  EYES: No blurred vision, pain, redness.  ENT: No tinnitus, ear pain, hearing loss.  RESPIRATORY: No cough, wheeze, hemoptysis.  CARDIOVASCULAR: Has chest pain, but no orthopnea, edema, arrhythmia.  GASTROINTESTINAL: No vomiting, diarrhea, abdominal pain.  GENITOURINARY: No dysuria, hematuria, frequency.  ENDOCRINE: No polyuria, nocturia, thyroid problems.  HEMATOLOGY: No anemia, easy bruising, bleeding.  INTEGUMENTARY: No acne, rash, lesions.  MUSCULOSKELETAL: No joint pain. Has some arthritis.  NEUROLOGIC: No  focal numbness, weakness, dysarthria, seizures.   PAST MEDICAL HISTORY:  1. Alzheimer's.  2. COPD.  3. CAD.  4. GERD.  5. Hypertension.  6. Left CVA.  7. Hyperlipidemia.  8. Obesity.   SOCIAL HISTORY: The patient smoked in the past, but quit many years back. Lives at home with her husband.   HOME MEDICATIONS: Include:  1. Protonix 40 mg daily.  2. Nitroglycerin 0.4 mg as needed for chest pain.  3. Metoprolol extended-release 100 mg daily.  4. Gabapentin 300 mg 3 times a day.  5. Plavix 75 mg daily.  6. Atorvastatin 40 mg daily.   ALLERGIES: No known drug allergies.   FAMILY HISTORY: Mother had diabetes.   PHYSICAL EXAMINATION:  VITAL SIGNS: Show temperature 97.9, pulse 57, respirations 17, blood pressure 127/76, saturating 95% on room air.  GENERAL: Morbidly obese Caucasian female. The patient sitting up in bed, eating her lunch. Seems comfortable.  HEENT: Atraumatic, normocephalic. Oral mucosa moist and pink. External ears and nose normal. No pallor. No icterus. Pupils bilaterally equal and reactive to light.  NECK: Supple. No thyromegaly. No palpable lymph nodes. Trachea midline. No carotid bruit or JVD.  CARDIOVASCULAR: S1, S2, without any murmurs. Peripheral pulses 2+. No edema.  RESPIRATORY: Normal work of breathing. Clear to auscultation on both sides.  GASTROINTESTINAL: Soft abdomen, nontender. Bowel sounds present. No hepatosplenomegaly palpable.  SKIN: Warm and dry. No petechiae, rash or ulcers.  MUSCULOSKELETAL: No joint swelling, redness, effusion of the large joints. Normal muscle tone.  NEUROLOGICAL: Motor strength 5/5 in upper and lower extremities. Sensation to fine touch intact all over. Cranial nerves II through XII intact.  GENITOURINARY: No CVA tenderness or bladder distention.   LABORATORY STUDIES:  Troponin and CK in the normal range.  Glucose 135, BUN 23, creatinine 1.62, sodium 128, potassium 4.3. WBC 7.6, hemoglobin 11.4, with D-dimer of 0.55.  EKG  shows normal sinus rhythm. No acute ST-T wave changes, unchanged from prior EKG. Ultrasound of bilateral kidneys shows no acute abnormalities.   ASSESSMENT AND PLAN:  1. Acute renal failure with creatinine of 1.62, baseline creatinine of 1. Will start the patient on IV fluids considering she also has hyponatremia. Will monitor fluids. Ultrasound of the kidneys has not shown any abnormalities.  2. Chest pain. The patient mentions this as chest pressure, seems to be exertional, although no response to nitroglycerin. Had recent negative stress test. Will consult cardiology. Although she had elevated D-dimer, she does not have hypoxia and no tachycardia. I feel this is low suspicion for a pulmonary embolism. Will rule it out with a V/Q scan. No full-dose anticoagulation at this time.  3. Hypertension. Continue medications.  4. Chronic obstructive pulmonary disease. Continue inhalers.  5. Gastroesophageal reflux disease. Continue PPIs.  6. Deep vein thrombosis prophylaxis with heparin.   CODE STATUS: Full code.   TIME SPENT TODAY ON THIS CASE: 43 minutes.   ____________________________ [name], MD srs:OSi D: 09/17/2012 13:24:50 ET T: 09/17/2012 14:19:24 ET JOB#: [id number]  cc: Wardell Heath R. Oluwademilade Mckiver, MD, <Dictator> Sheikh A. Ellsworth Lennox, MD Mallory Nancy, MD Orie Fisherman MD ELECTRONICALLY SIGNED 09/19/2012 8:41

## 2014-05-25 NOTE — Consult Note (Signed)
   Comments   I met with pt's husband and son who are her primary caregivers. Updated them on pt's current condition. Family recognize that pt may be approaching the end of life due to her refusal to take in any po. They have tried everything they know to do to get her to eat including getting her take-out from her favorite restaurant. They are comitted to honoring pt's wishes and keeping her at home and are interested in options to keep her out of the hospital.  has a documented wt loss of 21 lbs over the past 5 wks (188.6lbs on 9/29 => 167.2 on 11/4) which is > 10% of her total body wt. She has been hospitalized 6 times over the past 5 months. Her PPS based on her reduced oral intake is 30%. FAST score 6E. Albumin 2.4. Pt qualifies for hospice based on protein calorie malnutrition. I discussed with family and they are in agreement with home hospice. Hospice screen entered.  spoke with family about code status. After discussion, they agree that pt should be a DNR. Order entered. Out-of-facility DNR completed and placed in chart.  the event pt's poor appetite is due to depression, will start mirtazapine which might also stimulate appetite.  Dx: Protein calorie malnutrition    Secondary Dx: Alzheimer's dementia (FAST 6E), Debility (>10% wt loss/5 wks, albumin 2.4, 6 hospitalizations/5 months, PPS 30%), CKD.  Electronic Signatures: Aziza Stuckert, Izora Gala (MD)  (Signed 02-Dec-14 14:37)  Authored: Palliative Care   Last Updated: 02-Dec-14 14:37 by Patty Leitzke, Izora Gala (MD)

## 2014-05-25 NOTE — Discharge Summary (Signed)
PATIENT NAME:  Mallory Copeland, Mallory Copeland MR#:  [id number] DATE OF BIRTH:  Jun 30, 1940  DATE OF ADMISSION:  09/08/2012 DATE OF DISCHARGE:  09/10/2012  ADMITTING DIAGNOSIS:  Chest pain.   DISCHARGE DIAGNOSES:   1.  Chest pain of unclear etiology at this time, unlikely cardiac with negative cardiac enzymes for injury and negative Myoview.  2.  Epigastric abdominal pain of unclear etiology.   3.  Constipation.   4.  Acute renal failure, resolving on IV fluids.  5.  Hyponatremia from possible dehydration.  6.  Bradycardia with beta-blockers, now metoprolol is on hold.  7.  History of hypertension.  8.  Hyperlipidemia.  9.  Gastroesophageal reflux disease. 10.  Cerebrovascular accident.  11.  Chronic obstructive pulmonary disease.  12.  Alzheimer dementia.  13.  Coronary artery disease.   DISCHARGE CONDITION:  Stable.   DISCHARGE MEDICATIONS:  The patient is to resume Plavix 75 mg by mouth daily, pantoprazole 40 by mouth daily, atorvastatin 40 mg by mouth at bedtime, (Dictation Anomaly) gabapentin 300 mg by mouth 3 times daily as needed, Nitrostat 0.4 mg sublingually every five minutes as needed, Namenda 10 mg by mouth twice daily, Colace 100 mg by mouth twice daily.   HOME OXYGEN:  None.   DIET:  2 gram salt, low fat, low cholesterol, regular consistency.   ACTIVITY LIMITATIONS:  As tolerated.   FOLLOW-UP APPOINTMENT:  With Dr. Ellsworth Lennoxejan-Sie in two days after discharge.    CONSULTANTS:  1.  Care management.  2.  Dr. Gwen PoundsKowalski.  3.  Nurse practitioner, Rudi Cocoonna Carroll.   RADIOLOGIC STUDIES:  Chest x-ray, PA and lateral, 09/08/2012 showed findings likely reflecting mild acute cardiac decompensation superimposed upon chronic CHF.  No focal pneumonia was noted.  Myoview stress test 09/09/2012 revealed no significant wall motion abnormality noted.  Pharmacological myocardial perfusion study with no significant ischemia, estimated ejection fraction was 59%.  Left ventricular global function was normal.  No EKG  changes concerning for ischemia.  No artifact noted on this study.  Ultrasound of kidneys, bilateral, 09/10/2012 showed mild cortical thinning that is out of proportion to the patient's age.  There is no evidence of acute abnormality of either kidney.    HOSPITAL COURSE:   1.  The patient is a 74 year old Caucasian female with past medical history significant for history of coronary artery disease who presents to the hospital on 09/08/2012 with complaints of chest pains.  Please refer to Dr. Larose HiresVachhani's admission note on 09/08/2012.  On arrival to the Emergency Room, the patient's vitals showed temperature of 97.9, pulse was 47, respiratory rate was 16 to 18, blood pressure 134/91, O2 sats were 95% on room air.  Physical exam was unremarkable.  The patient's lab data done in the Emergency Room on the day of admission, 09/08/2012 revealed elevation of BUN and creatinine to 24 and 1.57 respectively.  Sodium was low at 129.  Beta type natriuretic peptide was 217.  Glucose was 140, otherwise BMP was unremarkable.  The patient's liver enzymes were normal.  Cardiac enzymes x 3 were within normal limits.  TSH was normal at 2.97.  White blood cell count was normal at 6.2, hemoglobin was 11.4 and platelet count was 236.  Urinalysis was unremarkable.  EKG showed sinus brady at 51 beats per minute, possible anterior infarct, age undetermined, but no significant change since prior EKG done in July 2013.  Chest x-ray was unremarkable.  The patient was admitted to the hospital for further evaluation.  She was started on  aspirin as well as nitroglycerin.  Cardiac enzymes were cycled.  Cardiology consultation with Dr. Gwen Pounds was obtained.  The patient underwent a Myoview stress test on 09/09/2012 which was unremarkable.  Dr. Gwen Pounds felt that the patient's chest pain was likely noncardiac and did not recommend any further work-up.  The patient was also noted to be bradycardic with a heart rate in the 40s to 50s and  beta-blocker metoprolol stopped.  After stopping beta-blocker the patient's blood pressure was noted to be somewhat a little elevated with IV fluid administration for renal insufficiency and the patient was given one dose of 5 mg of Norvasc prior to discharging from the hospital.  It is recommended to follow the patient's blood pressure as well as heart rate and make decisions about restarting her back on medications to control her blood pressure better if needed.   2.  In regards to acute renal failure, the patient's kidney function was abnormal when she came to the hospital.  The patient had a post-voidal bladder scan done which was unremarkable as well as ultrasound of her kidneys done which was also unremarkable.  She was given IV fluids and with this, the patient's kidney function improved.  On the day of discharge, 09/10/2012 the patient's creatinine is 1.37.  It is recommended to follow the patient's kidney function as outpatient and make decisions about nephrology consultation if needed.  The patient was hyponatremic in the hospital.  Her sodium level however did not change significantly.  On day of admission, the patient's sodium was 129.  The patient's sodium level remains somewhat low to 130s by 09/10/2012.  It is recommended to follow the patient's sodium level as outpatient, make decisions about evaluation and treatment if needed.  3.  The patient was complaining of some epigastric abdominal pain while in the hospital which was felt to be possibly the cause of her chest discomfort as well.  The patient's labs however were unremarkable and pain according to the patient was coming on and off intermittently for the past one month with no significant association with meals.  We did not work-up her abdominal discomfort as it was not present during my evaluation on 09/10/2012, however on palpation the patient did have some sensitivity in the epigastrium area.  It is recommended to follow up with  gastroenterologist if the patient's discomfort does not resolved over a period of time.   4.  In regards to chronic medical problems such as hyperlipidemia, gastroesophageal reflux disease, CVA, Alzheimer's dementia as well as COPD, the patient is to continue her outpatient management.    The patient is being discharged in stable condition with the above-mentioned medications and follow-up.    Her vital signs on the day of discharge temperature is 98.2, pulse was 70s to 80s, respiratory rate was 12 to 19, blood pressure ranging from 119 to 154 by the end of the day systolic and 70s diastolic, O2 sats were 97% on room air at rest.   TIME SPENT:  40 minutes.    ____________________________ Katharina Caper, MD rv:ea D: 09/10/2012 18:54:53 ET T: 09/10/2012 23:31:05 ET JOB#: [id number]  cc: Katharina Caper, MD, <Dictator> Silas Flood. Ellsworth Lennox, MD Katharina Caper MD ELECTRONICALLY SIGNED 09/11/2012 19:11

## 2014-05-25 NOTE — Consult Note (Signed)
   General Aspect 74 year old female with history of CAD, CVA, hypertension, hyperlipidemia, ex-smoker with COPD, mild Alzheimer's who was recently admitted with complaints of chest pain similar to her complaints today...  Patient is a poor historian due to her Alzheimer's .She is being admitted for further workup after presenting to the er wiht complaints of mid sternal chest pain. She does not state that the symptoms are worse wiht exertion.  Her cardiac enzymes and negative EKG is unremarkable for ischemia or injury and is unchanged from admission last week. .  She reports compliance with her medicaitons. Cardiac catheterization in 2012 revealed insignificant disease.   Physical Exam:  GEN no acute distress, obese   HEENT PERRL   NECK thyroid not tender   RESP normal resp effort  clear BS   CARD Regular rate and rhythm  Bradycardic  No murmur   ABD denies tenderness  normal BS   EXTR negative cyanosis/clubbing, negative edema   SKIN normal to palpation, skin turgor decreased   NEURO cranial nerves intact, motor/sensory function intact   PSYCH alert, poor insight, memory issues   Review of Systems:  Subjective/Chief Complaint Chest pain   General: No Complaints   Skin: No Complaints   ENT: No Complaints   Eyes: No Complaints   Neck: No Complaints   Respiratory: No Complaints   Cardiovascular: Chest pain or discomfort   Gastrointestinal: No Complaints   Genitourinary: No Complaints   Vascular: No Complaints   Musculoskeletal: No Complaints   Neurologic: No Complaints   Hematologic: No Complaints   Endocrine: No Complaints   Psychiatric: No Complaints   Review of Systems: All other systems were reviewed and found to be negative   ROS limited historian   Medications/Allergies Reviewed Medications/Allergies reviewed   EKG:  EKG NSR   Abnormal NSSTTW changes   Interpretation bradycardic    Penicillin: Unknown  Codeine: Unknown  Aspirin:  Unknown  Tape: Unknown  Sulfa drugs: Unknown  Latex: Unknown   Impression 74 year old female with history of CAD, CVA, hypertension hyperlipidemia, COPD, renal insufficiency and also insignficant cad by cardiac cath in 2012. She was admitted with chest pian one week ago and echo and funcitonal study were both unremarkable for ischemia or structural disease. She has ruled out for an mi thus far. Etiology of chest pain is unclear but is unclikely to be ishcmeic given recent noninvasive evaluaiton. Consideration for invasive reevaluation of coronary anatomy is somewhat high risk due to her GFR of 32. Will need ot rule out noncardiac sources of chest pain.   Plan 1. Hold beta blockers due to bradycardia. 2.Low fat diet, low carbohydrate diet.. 3. Continue to rule out for mi 4. Consider gi source of pain 5. Will follow with you   Electronic Signatures: Dalia HeadingFath, Evelio Rueda A (MD)  (Signed 16-Aug-14 13:48)  Authored: General Aspect/Present Illness, History and Physical Exam, Review of System, EKG , Allergies, Impression/Plan   Last Updated: 16-Aug-14 13:48 by Dalia HeadingFath, Babak Lucus A (MD)

## 2014-05-25 NOTE — Discharge Summary (Signed)
PATIENT NAME:  Mallory Copeland, Mallory Copeland MR#:  [id number] DATE OF BIRTH:  1940-08-05  DATE OF ADMISSION:  12/06/2012 DATE OF DISCHARGE:  12/08/2012  ADMITTING PHYSICIAN: Dr. Clerance LavPadmaja Vasireddy.  DISCHARGING PHYSICIAN:  Dr. Ellsworth Lennoxejan-Sie.   DISCHARGE DIAGNOSES: 1.  Acute kidney injury.  2.  Dehydration.  3.  Dementia.  4.  Failure to thrive.  5.  History of coronary artery disease.   PROCEDURES: None.   IMAGING: None.  HOSPITAL COURSE: This lady was admitted through the Emergency Room where she presented with postural dizziness. Her clinic evaluation included metabolic profile, revealed acute kidney injury with mostly due to dehydration. The patient once again reported poor oral intake, which was then confirmed by her husband. Please refer to the history and physical for details. She was admitted to the medical floor where she received intravenous fluids with improvement in her renal function biochemically and normalization of her pulse and blood pressure. The patient'Mallory Copeland husband refused palliative care consultation at this time or placement in an assisted living facility. He is aware that the patient'Mallory Copeland dementia is deteriorating and that his ability to care for her is declining; however, he remains adamant in his decision that she return to home. The patient is advised to maintain optimal oral fluid and food intake. This patient had recurrent admissions for this within the last several months, and I suspect that she will most likely be back in a  relatively short period of time.   DISCHARGE MEDICATIONS: Please refer to discharge medical reconciliation of notes. Dronabinol 2.5 mg twice a day was added to her home medications to promote increased appetite.   DISCHARGE INSTRUCTIONS: Discharged to home with home health for physical therapy and nursing.   DIET:  Low-sodium, low-fat diet.   ACTIVITY: As tolerated.   Follow up in 1 to 2 weeks with Dr. Ellsworth Lennoxejan-Sie.   DISCHARGE PROCESS TIME SPENT: 35 minutes.    ____________________________ Silas FloodSheikh A. Ellsworth Lennoxejan-Sie, MD sat:dmm D: 12/26/2012 14:09:14 ET T: 12/26/2012 20:48:03 ET JOB#: [id number]  cc: Sheikh A. Ellsworth Lennoxejan-Sie, MD, <Dictator> Charlesetta GaribaldiSHEIKH A TEJAN-SIE MD ELECTRONICALLY SIGNED 12/29/2012 12:41

## 2014-05-25 NOTE — H&P (Signed)
PATIENT NAME:  Mallory Copeland, [name] MR#:  [id number] DATE OF BIRTH:  1941-01-05  DATE OF ADMISSION:  12/05/2012  DATE OF ADMISSION: 12/06/2012.   PRIMARY CARE PHYSICIAN: Dr. Ellsworth Lennoxejan-Sie  REFERRING PHYSICIAN: Dr. Fanny BienQuale.   CHIEF COMPLAINT: Generalized weakness, not eating and drinking.   HISTORY OF PRESENT ILLNESS: The patient is a 74 year old female with history of advanced dementia, previous history of CVA, hypertension, hyperlipidemia, chronic obstructive pulmonary disease, coronary artery disease, had multiple admissions in the past for decreased p.o. intake and dehydration. The patient had a palliative care consult during the last admission and family declined for their follow-up. Per family, since the discharge, the patient has not been eating and drinking, especially worse in the last one week. The patient's baseline creatinine is 1.1. Has elevated BUN and creatinine of 53 and 2.54. Considering this, the patient will be admitted for hydration. Unable to obtain any history from the patient, secondary to advanced dementia. History is mainly obtained from the patient's husband and son, who are at bedside, who are primary caregivers of the patient. The patient denied having any chest pain, or pain in any part of the body.   PAST MEDICAL HISTORY:  1.  Advanced dementia.  2.  Coronary artery disease, status post myocardial infarction x2.  3.  Previous history of cerebrovascular accident.  4.  Gastroesophageal reflux disease.  5.  Previous history of diverticulitis.  6.  Hyperlipidemia.  7.  Hypertension.   ALLERGIES:  1.  PENICILLIN.  2.  CODEINE.  3.  ASPIRIN. 4.  TAPE.  5.  SULFA. 6.  LATEX.   HOME MEDICATIONS:  1.  Nitrostat 0.4 mg sublingual every five minutes as needed.  2.  Namenda 10 mg 2 times a day.  3.  MiraLax 17 grams once a day.  4.  Gabapentin 300 mg 3 times a day.  5.  Plavix 75 mg once a day.  6.  Atorvastatin 40 mg once a day.   SOCIAL HISTORY: Remote history of smoking.  No history of alcohol or drug use.  Married.  Lives with her husband.  Husband is the medical power of attorney. He had a discussion regarding the goals of care; wanted her to be full code.   FAMILY HISTORY: Could not be obtained from the patient secondary to advanced dementia.   REVIEW OF SYSTEMS: Could not be obtained from the patient secondary to advanced dementia.   PHYSICAL EXAMINATION:  GENERAL: This is a well-built, well-nourished, age-appropriate female lying down in the bed, not in distress.  VITAL SIGNS: Temperature 97.4, pulse 57, blood pressure 109/53, respiratory rate of 16, oxygen saturation is 98% on room air.  HEENT: Head normocephalic, atraumatic. No sclerae icterus. Conjunctivae normal. Pupils equal and react to light. Extraocular movements are intact. Mucous membranes moist. No pharyngeal erythema.  NECK: Supple. No lymphadenopathy. No JVD. No carotid bruits.  CHEST:  No focal tenderness.  LUNGS: Bilaterally clear to auscultation.  HEART:  S1 and S2 present. No murmurs are heard.  ABDOMEN: Bowel sounds present. Soft, nontender, nondistended. No hepatosplenomegaly. EXTREMITIES:  No pedal edema. Pulses 2+.  NEUROLOGIC: The patient is not oriented to place, person and time. No cranial nerve abnormalities. Moving all four extremities.  SKIN: No rash or lesions.  MUSCULOSKELETAL:  Good range of motion in all the extremities.   LABORATORY STUDIES:  CMP:  Glucose 91, BUN 53, creatinine of 2.54, rest of values are within normal limits. CBC: WBC of 7.6, hemoglobin 12.5, platelet count of 243.  ASSESSMENT AND PLAN: The patient is a 74 year old female with advanced dementia, with multiple admissions for failure to thrive, who comes to the Emergency Department again with another episode of acute renal failure.  1.  Acute renal failure secondary to dehydration. Continue with IV fluids and follow up.  2.  Failure to thrive. Discontinue the Neurontin as that could be contributing to  patient's drowsiness and decreased memory.   3.  Advanced dementia. Continue the Namenda. Discussed with the patient the natural progression of this Alzheimer dementia.  4.  Hypertension. Currently well controlled.  5.  Keep the patient on deep vein thrombosis prophylaxis with Lovenox.   TIME SPENT: 50 minutes.    ____________________________ Susa Griffins, MD pv:cg D: 12/06/2012 01:16:27 ET T: 12/06/2012 01:32:09 ET JOB#: [id number]  cc: Susa Griffins, MD, <Dictator> Sheikh A. Ellsworth Lennox, MD Susa Griffins MD ELECTRONICALLY SIGNED 12/31/2012 21:01

## 2014-05-25 NOTE — H&P (Signed)
PATIENT NAME:  Mallory Copeland, Mallory Copeland MR#:  [id number] DATE OF BIRTH:  1940-07-27  DATE OF ADMISSION:  01/01/2013  PRIMARY CARE PHYSICIAN: Dr. Ellsworth Lennox  REFERRING PHYSICIAN: Dr. Lenard Lance    CHIEF COMPLAINT: Severe weakness, debilitation, not eating or drinking anything.   HISTORY OF PRESENT ILLNESS: This is a 74 year old female with history of advanced dementia, now into end-stage dementia, coronary artery disease, previous CVAs, GERD,  hypertension and hyperlipidemia. The patient was recently admitted on 12/06/2012 and discharged on 12/08/2012. The patient came in mostly because of getting weak and dehydrated. She was diagnosed with acute kidney injury, dehydration, and failure to thrive. The patient was discharged home with her husband. Overall, she has been in significant decline. She is not  eating. The only water that she drinks is the one that is given to her with medications, and the family does not seem to be able to correct anything.   They brought her in today to the Emergency Department because of weakness and patient not eating much. In the Emergency Department waiting room, the patient actually was very debilitated. She was noticed to be bradycardic. The nurses were not able to get her out of the chair, and had a fainting spell. The patient had a heart rate in the 40s, for what IV fluids were given, and the patient was put on a dopamine drip. The patient was noted to have a very low blood pressure, not readable on the machine. Lowest blood pressure recorded on the vital signs is 70s/30s. The patient had an IJ central catheter, and she is on a dopamine drip. Whenever I evaluated the patient, the blood pressure is still 70s/30s, for what I added on another pressor, Levophed. The patient now is starting to pick up after 2 liters. Blood pressure did respond. She is receiving 2 more liters, as she looks severely dry.   The patient is starting to wake up a little bit more. She is going to be  transferred to the CCU. I had a long conversation with the husband, who at the beginning told me to do everything to bring her back, within reason, but then after a long conversation, the husband states that he knows better for her to be a modified DNR, with no CPR, no cardioversion or defibrillation, but it is okay to use medications like pressors, vasoactive substances, and okay to put her on a temporary pacemaker if necessary. It is okay to intubate her if it comes to aspiration or pure respiratory failure. If the patient is able to be on the ventilator only for 24 hours or so, and then making a decision to extubate her or not. The patient is admitted to the Critical Care Unit. Dr. Ellsworth Lennox is her primary care physician, for what we are going to transfer care to him in the morning.   REVIEW OF SYSTEMS:  Unable to obtain a review of systems due to patient's dementia. As per the family, the patient has been having a little bit of cough, but she has not had any significant fever. She has not complained of any pain. She has not had any changes in her baseline, other than being weak.   PAST MEDICAL HISTORY: 1.  Advanced dementia, now end-stage dementia, as patient is not eating or drinking.  2.  Coronary artery disease.  3.  MI x 2.  4.  CVA in the past.  5.  GERD.   6.  Hypertension.  7.  Hyperlipidemia.  8.  Diverticulosis, with previous  diverticulitis attacks.   ALLERGIES: TAPE, ASPIRIN apparently, CODEINE, PENICILLIN, also allergic to SULFA and LATEX.   SOCIAL HISTORY: The patient used to smoke in the past, but her husband is not aware when she quit. She does not drink, never used drugs. She is married. Husband is power of attorney.    FAMILY HISTORY: Unable to obtain due to advanced dementia. Husband is not aware of any problems. Unable to obtain any further information.   REVIEW OF SYSTEMS:  Unable to obtain due to patient's advanced dementia, as mentioned above.   PHYSICAL  EXAMINATION: VITAL SIGNS: Heart rate as low as 47 documented, respiratory rate 18 to 20, blood pressure 75/37, 71/43 are the lowest ones, oxygen saturation 97% on room air, temperature 98.1.  GENERAL:  The patient is now awake and starting to cooperate, but she is oriented, not on place, not on time, mostly only to person. She looks very dehydrated and pale.  HEENT: Her pupils are equal and reactive. Extraocular movements are intact. Mucosa are dry. Anicteric sclerae. Pink conjunctivae. No oral lesions. No oropharyngeal exudates.  NECK: Supple. No JVD. No thyromegaly. No adenopathy. No carotid bruits.  CARDIOVASCULAR: Regular rate and rhythm. Bradycardic. No rubs or gallops. No murmurs. No displacement of PMI.  LUNGS: Overall clear, with decreased respiratory sounds in both bases. No use of accessory muscles.  ABDOMEN: Soft, nontender, nondistended. No hepatosplenomegaly. No masses. Bowel sounds are positive.  GENITAL: Exam negative for external lesions.  EXTREMITIES: No edema, cyanosis or clubbing.  VASCULAR: Pulses +2. Capillary refill less than 3.  NEUROLOGIC: Cranial nerves II through XII intact. Cooperative, demented.  PSYCHIATRIC: Oriented only to person. No agitation.  LYMPHATIC: Negative for lymphadenopathy in the neck or supraclavicular area.  MUSCULOSKELETAL: No significant joint abnormalities or effusions.  SKIN: No rashes or petechiae.    LABS: Glucose 90, BUN 54, creatinine 4.42, sodium 131, potassium 5.2. GFR is around 9. Troponin is 0.02. White count is 6, hemoglobin 11.9, platelet count of 115. INR is 1.1. Urinalysis is pending at this moment. EKG: Sinus bradycardia, marked, without any ST depression or elevation. Chest x-ray:  There is airspace disease at the level of the left lower lobe that could represent the beginning of pneumonia versus atelectasis. The patient's family is telling me that she has been coughing.   ASSESSMENT AND PLAN:  A 74 year old female with severe  hypovolemic shock.   1.  Hypovolemic shock. The patient is likely just severely dehydrated from lack of nutrition, and she is not drinking anything due to her end-stage dementia. She has acute kidney injury. She is bradycardic, which she always has been bradycardic, but it just got worse, likely due to her significant fluid depletion. The patient is going to be admitted to the Critical Care Unit. She was started on dopamine. Now we have added on Levophed. The dopamine will serve the purpose of increase her heart rate. Her dopamine continued to stabilize her low blood pressures. The patient has a central line. We are going to monitor closely. IV fluids given, right now she is on her 4th liter, and her blood pressure is starting to pick up.   2.  The patient could have also septic shock. At this moment, does not seem to be clear evidence of infection, but she does have the beginning of a possible infiltrate on her lungs. Since that is the case, I am going to start her on Levaquin, give her one dose of vancomycin since she has been recently hospitalized, and  aztreonam. The patient, again, does not seem to have a clear infection, but since she is so ill, we better cover all our bases. Blood cultures are going to be drawn. If she continues to be afebrile and her repeat chest x-ray does not show consolidation, consider getting a CT scan of the chest to evaluate this atelectasis versus pneumonia, and that way we can stop the antibiotics. I am going to get a procalcitonin level. If it is below 2, we are going to stop the antibiotics.   3.  Acute kidney injury. The patient has this due to severe dehydration, intravascular volume depletion. We are going to continue IV fluids. Nephrology consulted.   4.  Bradycardia. The patient is always bradycardic. Now it is worse, symptomatic, for what we are just going to have Cardiology consultation. The patient has an external pacer already hooked up, and we will start it if she  continues to get bradycardic. Correct electrolytes as needed. Her potassium is a little bit high. We are going to repeat her labs after IV fluids. If potassium is still high, we are going to give her insulin in D50.   5.  We are going to place a Foley catheter. If she is not making any urine, we are going to ask for dialysis, but at this moment, she just needs to be rehydrated quickly.   6.  As far as her other medical problems, coronary artery disease, she is allergic to aspirin. Continue Plavix and not use aspirin. Cannot use beta blockers.  7.  As far as her gastroesophageal reflux disease, continue proton pump inhibitor.   8.  As far as her cerebrovascular accident, continue Plavix.   9.  Palliative Care consultation ordered. The patient is a modified code, okay to intubate if it is pure respiratory failure, like aspiration, maybe for a short time, 24 to 48 hours. Do not do cardiopulmonary resuscitation. Do not defibrillate to cardiovert. Okay to do a pacer. Okay to use vasoactive drugs. The patient is a patient of Dr. Ellsworth Lennox. We are going to transfer care to her.   Critical care time for 60 minutes today, as the patient is at high risk of cardiovascular collapse.     ____________________________ Felipa Furnace, MD rsg:mr D: 01/01/2013 16:16:00 ET T: 01/01/2013 16:59:29 ET JOB#: [id number]  cc: Felipa Furnace, MD, <Dictator> Kayode Petion Juanda Chance MD ELECTRONICALLY SIGNED 01/12/2013 18:05

## 2014-05-25 NOTE — H&P (Signed)
PATIENT NAME:  Mallory Copeland, Mallory Copeland MR#:  [id number] DATE OF BIRTH:  04-Jan-1941  PRIMARY CARE PHYSICIAN: Dr. Ellsworth Lennox.  PRIMARY CARDIOLOGIST: Dr. Adrian Blackwater.   REFERRING EMERGENCY ROOM PHYSICIAN: Dr. Minna Antis.  CHIEF COMPLAINT: Chest pain.   HISTORY OF PRESENT ILLNESS: The patient is a 74 year old female who was an ex-smoker, having history of COPD, hypertension, hyperlipidemia, coronary artery disease. Does not appear to be taking her medications regularly. Today morning, started having severe chest pain 8 to 9 out of 10 in her retrosternal area and which was constant,  as if somebody had been sitting on her chest, and also radiating to her left arm.   She had nitroglycerin tablet at home and tried 1 tab but did not help with the pain, and so finally husband brought her to the Emergency Room. In the ER, she received 1 or 2 more tablets, and her pain started to ease up. She is still hurting but says that pain is much better now, and she also feels this pain getting worse with deep breaths but denies any injury to the chest, any cough or fever.   She denies any similar chest pain in the past except that was 2 years ago when cardiac catheterization was done. She is not too much physically active but walks some and does day-to-day household work, walk without any cane or support and she says that during her day-to-day activities, she does not have this type of pain at any time.   Today morning, she was playing with her grandkids, which was not too much physical play or activity, and she started having this chest pain. With this pain, she was also feeling some shortness of breath and she was feeling nauseated and so decided to come to the Emergency Room. After getting nitroglycerin at home and in the ER, she started having a headache.   Due to her positive cardiac history and smoking history and noncompliance with the medication, her troponin is negative, but she is being admitted for further  management and observation on telemetry floor.   REVIEW OF SYSTEMS:  CONSTITUTIONAL: Negative for fever, fatigue, weakness, pain or weight loss.  EYES: No blurring, double vision, pain or redness.  ENT:  No tinnitus, ear pain, hearing loss.  RESPIRATORY: No cough, wheezing, hemoptysis or dyspnea.  CARDIOVASCULAR: There is chest pain but no orthopnea, edema or arrhythmia.  GASTROINTESTINAL: Denies any vomiting, diarrhea or abdominal pain but had significant nausea.  GENITOURINARY: Denies any dysuria, hematuria or increased frequency of the urination.  SKIN: Denies any acne or rashes.  MUSCULOSKELETAL: Denies any pain or swelling in the joints.  NEUROLOGICAL: Denies any numbness, weakness, epilepsy, or tremors.  PSYCHIATRIC: Denies any anxiety, insomnia or bipolar disorder.   PAST MEDICAL HISTORY:  1.  Alzheimer disease.  2.  Chronic obstructive pulmonary disease.  3.  Coronary artery disease.  4.  Esophageal reflux disease.  5.  Hypertension.  6.  Left ischemic stroke.  7.  Hyperlipidemia.  8.  Obesity   SOCIAL HISTORY: Smoker in the past stopped almost 15 to 20 years ago. Denies any alcohol use or illicit drug use. 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   FAMILY HISTORY: Noncontributory except mother had diabetes.   HOME MEDICATIONS:  1.  Pantoprazole 40 mg once a day.  2.  Nitroglycerin 0.4 mg tablet sublingual as needed basis.  3.  Metoprolol extended-release 100 mg once a  day.  4.  Gabapentin 300 mg 3 times a day.  5.  Clopidogrel 75 mg once a day.  6. Atorvastatin 40 mg once a day.   The patient claims not to be taking all these medication every day, and on the ward she also had some inhalers for her COPD which also she is not very compliant with at home.   PHYSICAL EXAMINATION:  VITAL SIGNS: Temperature 97.9, pulse rate 47, respirations 16 to 18, and blood pressure 134/91 and pulse oximetry 95% on room air.   GENERAL APPEARANCE: She is well developed, well nourished, alert and oriented. Does not appear in acute distress.  HEENT: Head and neck atraumatic. Conjunctivae pink. Oral mucosa moist.  NECK: Supple. No JVD.  RESPIRATORY: Bilateral clear air entry.  CARDIOVASCULAR: S1, S2 present. Regular. No murmur. No local tenderness on the chest.  ABDOMEN: Soft, nontender. Bowel sounds present. No organomegaly appreciated.  GENITOURINARY: Deferred.  MUSCULOSKELETAL: No swelling or tenderness in the joint. Range of motion adequate all the joints.  SKIN: No rashes inspected.  NEUROLOGICAL: Power 5/5. Moves all four limbs. Legs: No edema.  PSYCHIATRIC: Does not appear in any acute psychiatric illness at this time.   LABORATORIES: Glucose 140. BNP 217. BUN 24, creatinine 1.57, sodium 129, potassium 4.3, chloride 96. Troponin less than 0.02. WBC 6.6, hemoglobin 11.4, platelet count 236. Urinalysis grossly negative.   ASSESSMENT AND PLAN: A 74 year old, female, ex-smoker and had unstable angina in the past, cardiac catheter found having minimal blockages and was advised to continue medication therapy in the past, 2 years ago, came with severe chest pain radiating to left arm.  1.  Chest pain, possible unstable angina. We will admit her to telemetry for observation. Cardiologist consult for further management. Will follow serial troponins and we will give her  aspirin and Plavix. Her heart rate is running on the slower side and blood pressure is nearly normal, so we will not give her any beta blockers at this time. We will get echocardiogram to evaluate her baseline cardiac function.  2.  Hypertension. The patient has history of hypertension. Blood pressure is running within normal range over here and she claims not to be taking any medication at home so will not give her her medications, metoprolol, what is listed in the system.  3.  Hyperlipidemia. We will continue atorvastatin as written in the home medication  list, though she is not taking it. We will check lipid panel also.  4.  Gastroesophageal reflux disease. THE PATIENT STATES THAT WITH ASPIRIN ALSO, SHE HAS SEVERE REFLUX PROBLEM AND ACID reflux SO WE WILL CONTINUE PANTOPRAZOLE HERE .  5.  History of stroke. We will continue statin, aspirin and Plavix.  6.  Chronic obstructive pulmonary disease. Currently there is no wheezing, and the patient is comfortable, so will give her oxygen as needed basis. No need for any nebulizer or inhaler as she is not using any at home.   TOTAL TIME SPENT ON THIS ADMISSION: 50 minutes.   Condition and plan discussed with the patient and her husband.   CODE STATUS: FULL CODE.    ____________________________ Hope PigeonVaibhavkumar G. Elisabeth PigeonVachhani, MD vgv:np D: 09/08/2012 13:55:00 ET T: 09/08/2012 15:27:39 ET JOB#: [id number]  cc: Marland McalpineSheikh A. Ellsworth Lennoxejan-Sie, MD Laurier NancyShaukat A. Khan, MD Hope PigeonVaibhavkumar G. Elisabeth PigeonVachhani, MD, <Dictator>    Altamese DillingVAIBHAVKUMAR Hamdi Vari MD ELECTRONICALLY SIGNED 09/20/2012 11:15

## 2014-05-25 NOTE — Consult Note (Signed)
PATIENT NAME:  Mallory Copeland, Mallory Copeland MR#:  [id number] DATE OF BIRTH:  1940-08-19  DATE OF CONSULTATION:  01/02/2013  REFERRING PHYSICIAN: Dr. Berlinda LastSanchez-Gutierrez   CONSULTING PHYSICIAN:  Adrian BlackwaterShaukat Khan, MD  PRIMARY CARE PHYSICIAN: Dr. Bluford MainSheikh Tejan-Sie.   REASON FOR CONSULTATION: Symptomatic bradycardia.   HISTORY OF PRESENT ILLNESS: Mallory Copeland is a 74 year old female, who is known to our practice. The patient has a history of advanced dementia, mild coronary artery disease, previous cerebrovascular accident, gastroesophageal reflux disease, hypertension and hyperlipidemia. The patient has had multiple admissions this past summer and fall for renal failure and dehydration. The patient has been living with her husband and has not been eating or drinking very much fluids. On her admission in the Emergency Department, she was very weak, bradycardic with a heart rate  the 40s, had a blood pressure of 70/30 and was given IV fluids and pressors. The patient is an extremely poor historian and unable to give any significant history of present illness or review of systems at this time. She is aware of her location, but is unable to tell me the date or year.   PAST MEDICAL HISTORY:  1.  Advanced dementia. The patient is unable to sustain adequate p.o. intake.  2.  History of mild coronary artery disease with cardiac catheterization 08/22/2010 with minor luminal irregularities in the proximal left main, mid and proximal RCA, and mid LAD, 30% stenosis in the mid circumflex. Lexiscan stress testing on 09/09/2012 with no evidence of ischemia and normal left ventricular ejection fraction.  3.  History of myocardial infarction x 2.  4.  History of CVA.  5.  Gastroesophageal reflux disease.  6.  Hypertension.  7.  Hyperlipidemia.  8.  Diverticulosis with a history of previous diverticulitis.  9.  Chronic constipation.   ALLERGIES: TAPE, ASPIRIN, CODEINE, PENICILLIN, SULFA AND LATEX.   HOME MEDICATIONS: (this is per  her discharge summary from September)  1.  Plavix 75 mg p.o. daily.  2.  Lipitor 40 mg 1 tablet p.o. at bedtime.  3.  Gabapentin 300 mg 3 times daily as needed for pain.  4.  Sublingual nitroglycerin 0.4 mg as needed for chest pain.  5.  Namenda 10 mg twice daily.  6.  Pantoprazole 40 mg p.o. b.i.d.  7.  Senna twice daily as needed for constipation.   FAMILY HISTORY: Unable to obtain due to advanced dementia.   SOCIAL HISTORY: The patient was a former smoker. She does not drink any alcohol. Never used any illicit drugs. Lives with her husband, who is her power of attorney.   REVIEW OF SYSTEMS: Unable to be obtained.   PHYSICAL EXAMINATION:  GENERAL: This is a pleasant female, who is not in any acute distress. She is alert to place.  VITAL SIGNS: Temperature of 98.3 degrees Fahrenheit, heart rate 84, respiratory rate 22, blood pressure 133/111, oxygen saturation 98% on room air.  HEENT: Head atraumatic, normocephalic. Eyes: Pupils are round and equal. Conjunctivae are pink. Ears and nose normal to external inspection. Mouth: Good dentition.  NECK: Supple. Trachea is midline. No JVD. No carotid bruits.  LUNGS: No accessory muscle use. Lungs overall clear with some mildly decreased breath sounds in the bases.  CARDIOVASCULAR: Regular rate and rhythm. No murmurs, rubs, or gallops noted.  ABDOMEN: Obese, soft, nontender to palpation. There is no hepatosplenomegaly that can be appreciated.  EXTREMITIES: The patient has TED hose with trace edema of her legs.  SKIN: The patient has an area of ecchymosis along  her left back and buttocks.   ANCILLARY DATA: EKG on admission was sinus bradycardia, 45 beats per minute. Chest x-ray from 12/01 prominent cardiomediastinal contour, hypo-aeration with interstitial/vascular crowding, hazy left greater than right base opacities, suspect small effusions and atelectasis versus infiltrate.   LABORATORY DATA: Glucose 172, BUN 40, creatinine 3.11, sodium 131,  potassium 3.5, chloride 105. Estimated GFR is 14. Calcium is 7.5. Magnesium is 1.3, total protein is 6.6, albumin 3.0, total bilirubin 0.5, alkaline phosphatase 364, AST 27, ALT 11. Total CK 158. CK-MB 2.4. Troponin I initially was 0.03 followed by 0.02. White blood cell count is 11.5, hemoglobin 10.4, hematocrit 30.9, platelet count 101,000.   ASSESSMENT/PLAN:  1.  Bradycardia. The patient's heart rate was in the 40s on admission. She also had associated hypotension. The patient is currently not on any antihypertensive medication and has been maintained on pressors including Levophed and dopamine and blood pressure and heart rate are now stable. The patient's underlying hypotension and bradycardia could be from dehydration. I agree with hydration, which has improved her renal function and gradually weaning her off pressors.  2.  Coronary artery disease. The patient had mild coronary artery disease during her last catheterization in 2012 and has since had negative Lexiscan stress testing in August of this year. Her troponins have been negative and there are no acute EKG findings at this time.  3.  Acute kidney injury, likely secondary to severe dehydration and intravascular volume depletion. Nephrology has been consulted.  4.  Severe dementia. The patient's primary care physician is Dr. Ellsworth Lennox. She is currently on Namenda and palliative care has been consulted.   Thank you very much for this consultation and allowing [place] participate in this patient's care. We will continue to follow this patient with you.  ____________________________ Verta Ellen, PA-C mam:aw D: 01/02/2013 08:29:31 ET T: 01/02/2013 09:03:38 ET JOB#: [id number]  cc: Verta Ellen, PA-C, <Dictator> Necia Kamm A Zenda Herskowitz PA ELECTRONICALLY SIGNED 01/04/2013 8:34

## 2014-05-25 NOTE — Discharge Summary (Signed)
PATIENT NAME:  Mallory Copeland, [name] MR#:  [id number] DATE OF BIRTH:  1940/02/05  DATE OF ADMISSION:  01/01/2013 DATE OF DISCHARGE:  01/05/2013  ADMITTING PHYSICIAN:  Dr. Berlinda LastSanchez-Gutierrez.  DISCHARGE PHYSICIAN:  Dr. Ellsworth Lennoxejan-Sie.   DISCHARGE DIAGNOSES:  1.  Hypovolemic shock. 2.  Urinary tract infection with acute kidney injury.  3.  Failure to thrive.  4.  Alzheimer'Persephanie Laatsch dementia.  5.  A history of coronary artery disease.   HOSPITAL COURSE:  Please refer to the history and physical for full details. The patient was admitted through the Emergency Room where she presented with severe weakness and general debility. The husband once again admitted that he was unable to encourage her to eat or drink adequately. Her clinical features in the Emergency Room consistent with acute kidney injury, dehydration with hypovolemia. She received a bolus of intravenous fluids in the Emergency Room and was started on inotrope, namely Levophed and dopamine. She was admitted to Intensive Care Unit where she continued to receive high rate intravenous fluids, which resulted in weaning of her inotropes to off in a relatively short period of time. The patient was rather confused on the first 1 to 2 days from admission; however, her mental status did improve to her baseline. The patient was diagnosed with a urinary tract infection, for which she received appropriate broad-spectrum antibiotics. She also exhibited acute kidney injury, which resolved with aforementioned intravenous fluids. Palliative Care were consulted and convinced the husband to make the patient DO NOT RESUSCITATE and agreed to [facility]. The patient is discharged to home in a satisfactory condition with Hospice services.   DISCHARGE MEDICATIONS: 1.  Levaquin 500 mg daily x 5 days.  2.  Dronabinol 5 mg b.i.d.  3.  Resume home medications. Please refer to medical reconciliation for those.    DISCHARGE PROCESS TIME SPENT:  35  minutes.  ____________________________ Silas FloodSheikh A. Ellsworth Lennoxejan-Sie, MD sat:jm D: 01/14/2013 12:19:07 ET T: 01/14/2013 12:52:05 ET JOB#: [id number]  cc: Sheikh A. Ellsworth Lennoxejan-Sie, MD, <Dictator> Charlesetta GaribaldiSHEIKH A TEJAN-SIE MD ELECTRONICALLY SIGNED 01/16/2013 13:38

## 2014-05-26 NOTE — H&P (Signed)
PATIENT NAME:  Mallory Copeland, Mallory Copeland DATE OF BIRTH:  07-27-1940  DATE OF ADMISSION:  10/31/2012  REFERRING PHYSICIAN: Dr. Lucrezia EuropeAllison Webster.   PRIMARY CARE PHYSICIAN: Dr. Ellsworth Lennoxejan-Sie.    CHIEF COMPLAINT: Weakness.   HISTORY OF PRESENT ILLNESS: This is a 74 year old female with a past medical history of Alzheimer's dementia, CVA, hyperlipidemia, chronic obstructive pulmonary disease, coronary artery disease. The patient was recently discharged from [facility] with diagnosis of acute renal failure which resolved and urinary retention. The patient presents today with complaints of generalized weakness. The patient has a history of Alzheimer's dementia and is an extremely poor historian. History was obtained from the husband and the son at bedside. The patient has been feeling weak over the last few days, as well had decreased p.o. intake so presented to ED. The patient was found to be in acute renal failure with a creatinine of 2.25 and hyponatremia with a sodium of 124. The patient as well has been complaining of constipation. She had no bowel movement since discharge which is almost 10 days. As well, the patient had bladder scan done in the ED which did show more than 999 mL of postvoid residual volume. As mentioned, the patient is demented and cannot give any reliable history. She denied any dysuria or polyuria or upper abdominal pain. As well, the patient was weak, was unable to ambulate in the ED due to her weakness, so hospitalist service was requested to admit the patient.   PAST MEDICAL HISTORY:  1. Alzheimer dementia.  2. Chronic obstructive pulmonary disease.  3. Coronary artery disease.  4. Gastroesophageal reflux disease.  5. Hypertension.  6. Left CVA.  7. Hyperlipidemia.  8. Obesity.   SOCIAL HISTORY: The patient used to smoke in the past. Quit smoking many years ago. Lives at home with her husband. No history of alcohol use.   FAMILY HISTORY: Significant for diabetes in  her mother.   ALLERGIES: No known drug allergies.   HOME MEDICATIONS:  1. Protonix 40 mg p.o. b.i.d.  2. Sublingual nitroglycerin as needed.  3. Gabapentin 300 mg oral 3 times a day as needed.  4. Plavix 75 mg daily.  5. Atorvastatin 40 mg daily.  6. The patient is on laxative daily. Husband cannot recall the name.   REVIEW OF SYSTEMS: The patient has Alzheimer's dementia, extremely poor historian, cannot give any reliable review of systems but I tried my best.  GENERAL: The patient denies fever or chills. She reports not feeling well, feeling weak. No weight gain or loss.  EYES: Denies blurry vision, double vision, redness.  ENT: Denies tinnitus, ear pain, hearing loss or discharge.  RESPIRATORY: Denies cough, wheezing, hemoptysis.  CARDIOVASCULAR: Denies chest pain, edema, palpitation.   GASTROINTESTINAL: Denies nausea, vomiting, diarrhea, abdominal pain. Complains of constipation.  GENITOURINARY: Denies dysuria, hematuria, renal colic.  ENDOCRINE: Denies polyuria, polydipsia, heat or cold intolerance.  HEMATOLOGY: Denies easy bruising, bleeding diathesis.  INTEGUMENTARY: Denies acne, rash or skin lesions.  MUSCULOSKELETAL: Denies any joint pain, cramps, redness. Complains of generalized weakness.  NEUROLOGIC: Denies any focal deficits or numbness, dysarthria. Complains of feeling dizzy and generally weak.  PSYCHIATRIC: Denies anxiety or depression.   PHYSICAL EXAMINATION:  VITAL SIGNS: Temperature 98, pulse 72, respiratory rate 15, blood pressure 100/64, saturating 100% on oxygen.  GENERAL: Obese, well-nourished female, looks comfortable, in no apparent distress.  HEENT: Head atraumatic, normocephalic. Pupils equal, reactive to light. Pink conjunctivae. Anicteric sclerae. Dry oral mucosa.  NECK: Supple. No thyromegaly. No  JVD.  CHEST: Good air entry bilaterally. No wheezing, rales, rhonchi.  CARDIOVASCULAR: S1, S2 heard. No rubs, murmur or gallops.  ABDOMEN: Obese, soft,  nontender, nondistended. Bowel sounds present. Had suprapubic fullness due to distended bladder.  EXTREMITIES: No edema. No clubbing. No cyanosis. Dorsalis pedis pulse +2 bilaterally.  SKIN: Warm and dry. No rash.  PSYCHIATRIC: The patient is awake, alert x 2, confused but pleasant.  NEUROLOGIC: Cranial nerves grossly intact. Motor 5 out of 5. No focal deficits. Sensation is intact to light touch.  LYMPHATIC: No cervical or supraclavicular lymphadenopathy.   PERTINENT LABORATORIES: Glucose 127, BUN 29, creatinine 2.25, sodium 124, potassium 4.5, chloride 90. Troponin less than 0.02. White blood cells 6.4, hemoglobin 11.3, hematocrit 32.1, platelets 271. Urinalysis negative for leukocyte esterase and nitrite.   ASSESSMENT AND PLAN:  1. Acute renal failure: This appears due to urinary retention/obstructive uropathy and volume depletion. The patient has urinary retention with postvoid residual volume more than 1 liter, so will insert a Foley catheter. As well, she appears to be having decreased p.o. fluid intake. Will continue with intravenous fluid and hydrate her.  2. Urinary retention: Will insert Foley catheter.  3. Hyponatremia: This is most likely due to her renal failure and volume depletion. Will continue with intravenous fluids.  4. Constipation: The patient will be started on soap suds enema every 12 hours x 3 doses total and Dulcolax suppository every night. As well, will add her on p.r.n. MiraLAX, Senna, Colace and milk of magnesia.  5. Dementia: Continue with Namenda.  6. Hypertension: Actually, blood pressure is on the lower side and appears to be acceptable off any medication.  7. Hyperlipidemia: Continue with statin.  8. History of coronary artery disease: The patient denies any chest pain, any shortness of breath. Already on Plavix and aspirin.  9. Deep vein thrombosis prophylaxis: Will start the patient on subcutaneous heparin.  10. Gastrointestinal prophylaxis: The patient is on  proton pump inhibitor.   CODE STATUS: The patient is FULL CODE, has no Living Will. Husband is her healthcare power of attorney.   TOTAL TIME SPENT ON ADMISSION AND PATIENT CARE: 55 minutes.   ____________________________ Starleen Arms, MD dse:gb D: 10/31/2012 02:34:11 ET T: 10/31/2012 03:14:33 ET JOB#: [id number]  cc: Starleen Arms, MD, <Dictator> Mahum Betten Teena Irani MD ELECTRONICALLY SIGNED 11/01/2012 1:08

## 2014-05-27 NOTE — Consult Note (Signed)
PATIENT NAME:  Mallory Copeland, Mallory Copeland MR#:  [id number] DATE OF BIRTH:  1940/09/30  DATE OF CONSULTATION:  03/13/2011  REFERRING PHYSICIAN:  Bluford MainSheikh Tejan-Sie, MD CONSULTING PHYSICIAN:  Lurline DelShaukat Melrose Kearse, MD  REASON FOR CONSULTATION: Nausea.   HISTORY OF PRESENT ILLNESS: The patient is a 74 year old female with history of dementia, coronary artery disease, and hypertension. The patient was apparently admitted to the hospital a couple of weeks ago with acute gastroenteritis, presented with nausea, vomiting, and diarrhea. She did have acute renal failure at that point as well. She improved and was discharged. The patient continued to complain of significant nausea. Her oral intake dropped and she was again found to be in renal failure, most likely secondary to prerenal azotemia. The patient was admitted by Dr. Ellsworth Lennoxejan-Sie a few days ago. The patient has continued to complain of persistent nausea. Her oral intake is poor. An upper gastrointestinal was attempted yesterday, but she could not drink enough barium to evaluate her stomach and duodenum. Esophagus appeared normal on upper gastrointestinal series. The patient had an upper gastrointestinal endoscopy in July of last year as well as August 2011 and those are fairly unremarkable. She denies vomiting. She denies any further diarrhea or constipation.   PAST MEDICAL HISTORY:   1. Dementia. 2. Chronic obstructive pulmonary disease. 3. Coronary artery disease. 4. Esophageal reflux.  5. Hypertension.  6. History of stroke. 7. Hyperlipidemia. 8. Obesity.   HOME MEDICATIONS:  1. Amlodipine. 2. Aricept. 3. Celebrex. 4. Dulcolax. 5. Isosorbide. 6. Lasix. 7. Lipitor. 8. Lisinopril. 9. Loratadine. 10. Nitrostat. 11. Pantoprazole. 12. Plavix. 13. Promethazine. 14. Proventil. 15. Reglan. 16. Spiriva.  17. Toprol. 18. Xanax.   ALLERGIES: Aspirin, iodine, penicillin and sulfa.   SOCIAL HISTORY: She is an ex-smoker.   FAMILY HISTORY: Unremarkable.    REVIEW OF SYSTEMS: Grossly negative except for what is mentioned in the History of Present Illness.   PHYSICAL EXAMINATION:  GENERAL: Obese female, appears pale. No jaundice was noted.   VITAL SIGNS: Temperature 98, pulse 74, respirations 18, blood pressure 100/61.   HEENT: Otherwise unremarkable.   LUNGS: Grossly clear to auscultation bilaterally.   CARDIOVASCULAR: Regular rate and rhythm.   ABDOMEN: Soft and benign, somewhat distended. No rebound or guarding was noted.   NEUROLOGIC: Examination is quite unremarkable.   LABORATORY, DIAGNOSTIC, AND RADIOLOGICAL DATA: Ultrasound of the kidneys is unremarkable. Upper GI as above. Chest x-ray shows some cardiomegaly. White cell count 6.7, hemoglobin 11.4, hematocrit 33.7, and platelet count 192. Electrolytes: Creatinine is now down to 1.77. The rest of the electrolytes are unremarkable. LFTs are normal. Her urinalysis showed 2+ leukocyte esterase, otherwise unremarkable. Urine cultures are negative.   ASSESSMENT AND PLAN: The patient is with persistent nausea, inability to eat and prerenal azotemia with renal failure. The exact etiology of her nausea is not clear. The patient has a history of using Reglan raising concerns about possible underlying gastroparesis although the patient has no diabetes and all other obvious risk factors for gastroparesis. She had an upper GI endoscopy about 6 or 7 months ago which was unremarkable. Upper GI series could not be done due to her inability to drink enough barium. The patient also has dementia and her history is not very clear. Central nausea from inner negative infection or other similar problems is another possibility. I do not see any medications on her list that would cause significant nausea. We will consider an upper gastrointestinal endoscopy plus/minus a gastric emptying scan. An ultrasound of upper abdomen may also be  helpful. Continue Zofran for now although Reglan which the patient has used in  the past might be of benefit as well. Further recommendations to follow after above-mentioned testing. Will follow.    ____________________________ Lurline Del, MD si:ap D: 03/13/2011 08:48:31 ET T: 03/13/2011 09:58:18 ET JOB#: [id number]  cc: Lurline Del, MD, <Dictator> Lurline Del MD ELECTRONICALLY SIGNED 03/19/2011 12:58

## 2014-05-27 NOTE — Consult Note (Signed)
Brief Consult Note: Diagnosis: Nausea.   Patient was seen by consultant.   Discussed with Attending MD.   Comments: Patient with persistant nause and h/o vomiting and poor intake. EGD few months ago was unremarkable except for gastritis. Positive ARF. Patient has been on Reglan at home.  Impression: ? Gastroparesis. Other acute UGI pathology is less likely.   Recommendations: Will obtain UGI to asses both structural and functional status of UGI tract. Further recommendations to follow.  Electronic Signatures: Lurline DelIftikhar, Janeen Watson (MD)  (Signed 06-Feb-13 13:02)  Authored: Brief Consult Note   Last Updated: 06-Feb-13 13:02 by Lurline DelIftikhar, Karriem Muench (MD)

## 2014-05-27 NOTE — Consult Note (Signed)
Chief Complaint:   Subjective/Chief Complaint Continue to c/o nausea as well as dysphagia. I do not believe repeat attemp at gastric emptying scan would be successful. Will proceed with an EGD in am. Further recommendations to follow.  Discussed with her and her husband and they are in full agreement.   Electronic Signatures: Lurline DelIftikhar, Tameaka Eichhorn (MD)  (Signed 11-Feb-13 17:58)  Authored: Chief Complaint   Last Updated: 11-Feb-13 17:58 by Lurline DelIftikhar, Chibuikem Thang (MD)

## 2014-05-27 NOTE — H&P (Signed)
PATIENT NAME:  Mallory Copeland, Jleigh M MR#:  [id number] DATE OF BIRTH:  20-Oct-1940  DATE OF ADMISSION:  02/18/2011  PRIMARY CARE PHYSICIAN: Dr. Ellsworth Lennoxejan-Sie   GASTROENTEROLOGIST: Dr. Niel HummerIftikhar   REQUESTING PHYSICIAN: Dr. Enedina FinnerGoli    CHIEF COMPLAINT: Nausea, vomiting, and rash.   HISTORY OF PRESENT ILLNESS: The patient is a 74 year old female with a known history of coronary artery disease, gastroesophageal reflux disease, and CVA who is being admitted for nausea, vomiting, and rash. The patient started having severe nausea and vomiting that started last Monday about a week to 10 days ago and she was trying to manage it at home but it kept getting worse where she was unable to eat much. She also noticed some black stool this morning and started getting a rash all over her body starting from her chest and spreading to her extremities and decided to come to the Emergency Department. The rash started several days ago and has been itching. She has also been complaining of some hip pain although she denies any fall. She denies any fever, sick contacts, or any insect bite. She has been unable to keep anything orally down. She is being admitted for further evaluation and management.   PAST MEDICAL HISTORY:  1. Coronary artery disease.  2. Gastroesophageal reflux disease. 3. Ischemic cardiomyopathy with ejection fraction of 25 to 35%.  4. History of cerebrovascular accident.  5. Hyperlipidemia.  6. History of diverticulosis/diverticulitis.  7. Hypertension.  8. Chronic palpitations.  9. History of ischemic colitis.   ALLERGIES: Aspirin, codeine, penicillin, sulfa, and latex.    MEDICATIONS AT HOME: Based on patient information although she is not recalling it very accurately:  1. Plavix 75 mg p.o. daily.  2. Metoprolol ER 100 mg p.o. daily.  3. Omeprazole 40 mg p.o. daily.  4. Loratadine 1 tablet p.o. daily.    SOCIAL HISTORY: No smoking, alcohol, or drug use. She is married and lives with her husband.    FAMILY HISTORY: Mother had MI at the age of 73. Father had MI at the age of 73. Also, family history of hypertension and stroke.   REVIEW OF SYSTEMS: CONSTITUTIONAL: No fever. Positive fatigue and weakness. EYES: No blurry or double vision. ENT: No tinnitus or ear pain. RESPIRATORY: No cough, wheezing, or hemoptysis. CARDIOVASCULAR: No chest pain, orthopnea, or edema. GI: Positive for nausea and vomiting. No constipation or diarrhea. GU: No dysuria or hematuria. ENDOCRINE: No polyuria or nocturia. HEMATOLOGY: No anemia or easy bruising. SKIN: She has a rash on her chest and also on her extremities. MUSCULOSKELETAL: No arthritis or muscle cramps. NEUROLOGIC: No tingling, numbness, or weakness. PSYCHIATRIC: No history of anxiety or depression.   PHYSICAL EXAMINATION:   VITAL SIGNS: Temperature 98.6, heart rate 87 per minute, respirations 18 per minute, blood pressure 147/84 mmHg. She is saturating 98% on room air.   GENERAL: The patient is a 74 year old female lying in the bed comfortably without any acute distress.   EYES: Pupils equal, round, reactive to light and accommodation. No scleral icterus. Extraocular muscles intact.   HEENT: Head atraumatic, normocephalic. Oropharynx and nasopharynx clear.   NECK: Supple. No jugular venous distention. No thyroid enlargement or tenderness.   LUNGS: Clear to auscultation bilaterally. No wheezing, rales, rhonchi, or crepitation.   CARDIOVASCULAR: S1, S2 normal. No murmurs, rubs, or gallop.   ABDOMEN: Soft, nontender, nondistended. Bowel sounds present. No organomegaly or mass.   EXTREMITIES: No pedal edema, cyanosis, or clubbing.   NEUROLOGIC: Nonfocal examination. Cranial nerves III to XII  intact. Muscle strength 5 out of 5 in all extremities. Sensation intact.   PSYCH: The patient is oriented to time, place, and person x3.   SKIN: She has a vesicular rash less than a cm on her right ankle area. It's a small blister on there. She also has  small vesicles all over her chest, some on the face, scattered, not in groups. These are itching and fluid filled. Some of them are crusted.    LABORATORY, DIAGNOSTIC, AND RADIOLOGICAL DATA: Normal BMP except BUN of 36, creatinine 1.81, blood glucose 133. Normal liver function tests. Normal first set of cardiac enzymes. Normal CBC except hemoglobin of 11.7, hematocrit 34.6. Normal urinalysis. EKG shows sinus rhythm with occasional PVCs.   IMPRESSION AND PLAN:  1. Nausea and vomiting, possibly viral in nature. She had a complete GI work-up on last admission with Dr. Niel Hummer and was negative. Will ask Dr. Niel Hummer to see her again. Provide symptomatic treatment for now.  2. Rash, vesicular in nature, looks more like impetigo, likely Staph or Strep infection. Will ask Dr. Leavy Cella to see her. She has multiple allergies. For now will treat her with clindamycin. This could be some allergic rash also.  3. Acute renal failure, likely prerenal in nature. Will start on IV fluids. Avoid any nephrotoxins. Hold her hydrochlorothiazide if she is on it and monitor.  4. Coronary artery disease. Will continue Plavix at this time.  5. Possible flulike symptoms with nausea, vomiting, and body ache. Will check Influenza test.  6. Prophylaxis. Heparin sub-Q and Protonix.       Service has been transferred to Dr Ellsworth Lennox and Peggye Form discussed with him and has accepted this patient.  TOTAL TIME TAKING CARE OF THIS PATIENT: 55 minutes.  ____________________________ Ellamae Sia. Sherryll Burger, MD vss:drc D: 02/18/2011 13:08:02 ET T: 02/18/2011 13:23:00 ET JOB#: [id number] cc: Simmone Cape S. Sherryll Burger, MD, <Dictator>, Silas Flood. Ellsworth Lennox, MD Patricia Pesa MD ELECTRONICALLY SIGNED 02/19/2011 10:58

## 2014-05-27 NOTE — Consult Note (Signed)
Impression: [phone] WF w/ h/o diverticulitis and ischemic colitis admitted with probable viral gastroenteritis and a localized pustular rash.  Despite her GI history, she does not appear to have an acute abdomen or any evidence for perforation or bacterial infection.  No stool studies have been sent.  Would continue hydration. She is afebrile and her WBC is normal.  I do not see any evidence for a bacterial infection.  Will stop the clindamycin. Her rash is localized to her right foot.  There appear to be pustules and papules.  While it is not in a classic dermatomal distribution, she has some burning sensation that could be consistent with Varicella. Will get a viral culture of one of the pustules. She noted the rash three days ago.  If this is VZV, she is within the timeframe to start therapy.  Will give her valtrex. Place on airborne isolation.  Electronic Signatures: Kathryn Linarez, Rosalyn GessMichael E (MD)  (Signed on 17-Jan-13 11:42)  Authored  Last Updated: 17-Jan-13 11:42 by Hiliary Osorto, Rosalyn GessMichael E (MD)

## 2014-05-27 NOTE — Consult Note (Signed)
PATIENT NAME:  Mallory Copeland, Mallory Copeland MR#:  [id number] DATE OF BIRTH:  1940/07/01  DATE OF CONSULTATION:  02/19/2011  REFERRING PHYSICIAN:  Dr. Ellsworth Lennoxejan-Sie  CONSULTING PHYSICIAN:  Rosalyn GessMichael E. America Sandall, MD  REASON FOR CONSULTATION: Rash.  HISTORY OF PRESENT ILLNESS: The patient is a 74 year old white female with a past history significant for diverticulitis and ischemic colitis who was admitted yesterday with nausea, vomiting, and rash. The patient states that she had been sick for about a week. She has not been eating much over the last four days because of her nausea and vomiting. She's had no significant fevers or chills. She's not had significant diarrhea or abdominal pain. She has not had any respiratory complaints, although she has some occasional cough. She has no malaise or myalgias. Approximately three days ago, she noted a rash developing over her right foot. She's had some blisters and pustular areas as well as redness. She does not have pain in the foot but describes a discomfort that was more tingly. She has not had any lesions noted elsewhere. She was admitted to the hospital and was started on clindamycin. The history and physical indicates that she had a rash that's been all over, however, currently she reports only a rash on her right foot.   ALLERGIES: Aspirin, codeine, penicillin, sulfa, and latex.    PAST MEDICAL HISTORY: 1. Coronary artery disease. 2. Ischemic cardiomyopathy. 3. GE reflux. 4. Stroke. 5. Hypercholesterolemia. 6. Diverticulitis. 7. Hypertension. 8. Ischemic colitis.   SOCIAL HISTORY: The patient lives with her husband. She does not smoke nor does she drink. No injecting drug use history.   FAMILY HISTORY: Positive for coronary artery disease, hypertension, and stroke.   REVIEW OF SYSTEMS: GENERAL: No fevers, chills, or sweats. She's had some malaise and fatigue, however. HEENT: No headaches. No sinus congestion. No sore throat. She does have some chronic nasal  congestion that is no worse than her usual. NECK: No stiffness. No swollen glands. RESPIRATORY: Occasional cough. No significant shortness of breath. No sputum production. CARDIAC: No chest pains or palpitations. GI: Positive nausea, vomiting, and anorexia. No significant abdominal pain. No loose stool. GU: No complaints. MUSCULOSKELETAL: She's had no muscle or joint complaints. SKIN: She's had rash that's been mainly on her right foot as described in the history of present illness. NEUROLOGIC: No focal weakness. PSYCHIATRIC: No complaints.   PHYSICAL EXAMINATION:   VITAL SIGNS: T-max 98.6, pulse 60, blood pressure 119/60, 95% on room air.  GENERAL: 74 year old white female in no acute distress.  HEENT: Normocephalic and atraumatic. Pupils equal and reactive to light. Extraocular motion intact. Sclerae, conjunctivae, and lids are without evidence for emboli or petechiae. Oropharynx shows no erythema or exudate. Gums are in fair condition.  NECK: Supple. Full range of motion. Midline trachea. No lymphadenopathy.  CHEST: Clear to auscultation bilaterally with good air movement. No focal consolidation.  CARDIAC: Regular rate and rhythm without murmur, rub, or gallop.  ABDOMEN: Soft, nontender, and nondistended. No hepatosplenomegaly. No hernias noted.  EXTREMITIES: No evidence for tenosynovitis.  SKIN: She has several lesions over the right foot. These are more medial but there are a few on the dorsum of the foot and a few on the plantar aspect. They are all erythematous. Many of them are papular. A few of them are pustular. There are one or two that appear to be approximately 2 mm in size with pustules with an erythematous base. There was no significant erythema separate from the individual lesions. She had scattered papular  lesions on her chest and rarely on her neck. It is unclear if these are the same lesions as none of the other lesions were pustular or vesicular. There were no lesions on the  left lower extremity. There were no significant lesions going into the lower leg on the right as well. There was no stigmata of endocarditis, specifically no Janeway lesions or Osler nodes.   NEUROLOGIC: The patient is awake and interactive, moving all four extremities.  PSYCHIATRIC: Mood and affect appeared normal.   LABORATORY, DIAGNOSTIC, AND RADIOLOGICAL DATA: BUN 17, creatinine 1.26, bicarbonate 26, anion gap 12. LFTs within normal limits. White count 7.2, hemoglobin 10.0, platelet count 194, ANC 2.1. White count on admission 7.3. BUN and creatinine on admission were 36 and 1.81 respectively. A wound culture from the right ankle is showing no growth to date. Gram stain showed no organisms. Rapid Influenza testing was negative. A urinalysis was unremarkable. A three-way of the abdomen showed cardiomegaly but no infiltrates. No evidence for bowel obstruction or perforation.   IMPRESSION: This is a 74 year old white female with a history of diverticulitis and ischemic colitis admitted with probable viral gastroenteritis and a localized pustular rash.  RECOMMENDATIONS: 1. Despite her GI history, she does not appear to have an acute abdomen or any evidence for perforation or bacterial infection. No stool studies have been sent and she's not been having diarrhea. Will continue hydration. 2. She is afebrile and her white count is normal. I don't see any evidence for bacterial infection. Will stop the clindamycin. 3. Her rash is localized to her right foot. There appeared to be pustules and papules. While it is not a classic dermatoma distribution, she has some burning sensation that could be consistent with varicella. 4. Will get a viral culture of one of the pustules. 5. She noted the rash three days ago. If this is VZV, she is within the timeframe to start therapy. Will give her Valtrex. 6. Will place on airborne isolation.  This is a moderately complex Infectious Disease Case. Thank you very much  for involving me in [name]'s care.  ____________________________ Rosalyn Gess. Keiyana Stehr, MD meb:drc D: 02/19/2011 11:54:00 ET T: 02/19/2011 12:23:49 ET JOB#: [id number]  cc: Rosalyn Gess. Dorse Locy, MD, <Dictator> Savier Trickett E Arianis Bowditch MD ELECTRONICALLY SIGNED 02/20/2011 10:36

## 2014-05-27 NOTE — Consult Note (Signed)
Pt seen and examined. Hx of gastritis in the past. Pt already on protonix. Prob gastroenteritis, which should resolve over next few days. However, if GI sxs continue over the weekend, then EGD on Monday. Thanks.  Electronic Signatures: Lutricia Feilh, Jiayi Lengacher (MD)  (Signed on 17-Jan-13 19:03)  Authored  Last Updated: 17-Jan-13 19:03 by Lutricia Feilh, Isiaha Greenup (MD)

## 2014-05-27 NOTE — Consult Note (Signed)
Chief Complaint:   Subjective/Chief Complaint EGD done. Fairly normal examination.  I believe her dysphagia and nausea is either central or may be related to an esophageal motility disorder. Cerebellar strokes can cause dysphagia so please consider an MRI of brain. Meanwhile will try Reglan 5 mg q 6 hours. Further recommendations to follow.   Electronic Signatures: Lurline DelIftikhar, Mykell Genao (MD)  (Signed 12-Feb-13 17:11)  Authored: Chief Complaint   Last Updated: 12-Feb-13 17:11 by Lurline DelIftikhar, Reino Lybbert (MD)

## 2014-05-27 NOTE — Consult Note (Signed)
Brief Consult Note: Diagnosis: Recurrent episodes of nausea and vomiting with associated abdominal pain.  Noted change in bowel habits.  Possible underlying etiological cause C. Diff given recent antibiotic therapy.   Discussed with Attending MD.   Comments: Discussed patient's presentation with Dr. Lutricia FeilPaul Oh.  At this time recommend proceeding with C. diff toxin A and B stool study.  Order placed.  Will continue to monitor patient's symptoms.  Electronic Signatures: [name], [name] (NP)  (Signed 17-Jan-13 17:02)  Authored: Brief Consult Note   Last Updated: 17-Jan-13 17:02 by [name], [name] (NP)

## 2014-05-27 NOTE — Consult Note (Signed)
Pt felt better this AM but worse after taking full liquid diet. Getting MRI of hips now. See yest's notes. If gastroenteritis, expect sxs resolve over next few days. HOwever, if sxs persist, then plan for EGD Monday afternoon. Thanks.  Electronic Signatures: Lutricia Feilh, Burnis Kaser (MD)  (Signed on 18-Jan-13 14:02)  Authored  Last Updated: 18-Jan-13 14:02 by Lutricia Feilh, Sally-Ann Cutbirth (MD)

## 2014-05-27 NOTE — Consult Note (Signed)
Chief Complaint:   Subjective/Chief Complaint continues to complain of nausea, eating little. mild epigastric abdominal pain.   VITAL SIGNS/ANCILLARY NOTES: **Vital Signs.:   10-Feb-13 10:39   Vital Signs Type Q 4hr   Temperature Temperature (F) 98.7   Celsius 37   Temperature Source oral   Pulse Pulse 77   Pulse source per Dinamap   Respirations Respirations 18   Systolic BP Systolic BP 383   Diastolic BP (mmHg) Diastolic BP (mmHg) 72   Mean BP 92   BP Source Dinamap   Pulse Ox % Pulse Ox % 97   Pulse Ox Activity Level  At rest   Oxygen Delivery Room Air/ 21 %   Brief Assessment:   Cardiac Regular    Respiratory clear BS    Gastrointestinal details normal Soft  Nondistended  No masses palpable  Bowel sounds normal  obese, tender epigastrum to palpation   Routine Chem:  10-Feb-13 05:51    Glucose, Serum 116   BUN 5   Creatinine (comp) 1.42   Sodium, Serum 140   Potassium, Serum 4.0   Chloride, Serum 108   CO2, Serum 24   Calcium (Total), Serum 8.5   Osmolality (calc) 278   eGFR (African American) 47   eGFR (Non-African American) 39   Anion Gap 8  Routine Hem:  10-Feb-13 05:51    WBC (CBC) 5.0   RBC (CBC) 3.18   Hemoglobin (CBC) 9.6   Hematocrit (CBC) 28.5   Platelet Count (CBC) 123   MCV 90   MCH 30.2   MCHC 33.7   RDW 16.3   Neutrophil % 23.1   Lymphocyte % 59.9   Monocyte % 9.9   Eosinophil % 6.3   Basophil % 0.8   Neutrophil # 1.1   Lymphocyte # 3.0   Monocyte # 0.5   Eosinophil # 0.3   Basophil # 0.0   Assessment/Plan:  Assessment/Plan:   Assessment 1) chronic nausea, post prandial abdominal pain.  difficult historian.  patietn declined the gastric emptying study.  I have changed the zofran to atc scheduled dosing for 36 hours, and it may be possible to re-attempt the gastric emptying study.  also recommend considering CT abd and pelvis/CTA once creatinine improves further to r/o other etiologies.  Patient had an egd in 7/12 but would consider  repeat if above uninformative.  Her early dementia may also be involved in the symptom complex as well.    Plan as above, Dr Dionne Milo to return tomorrow.   Electronic Signatures: Loistine Simas (MD)  (Signed 10-Feb-13 14:46)  Authored: Chief Complaint, VITAL SIGNS/ANCILLARY NOTES, Brief Assessment, Lab Results, Assessment/Plan   Last Updated: 10-Feb-13 14:46 by Loistine Simas (MD)

## 2014-05-27 NOTE — Consult Note (Signed)
PATIENT NAME:  Mallory Copeland, Mallory Copeland MR#:  [id number] DATE OF BIRTH:  04-Oct-1940  DATE OF CONSULTATION:  02/19/2011  REFERRING PHYSICIAN:   CONSULTING PHYSICIAN:  Verdie Shire, MD/Dawn Ruthe Mannan, NP  PRIMARY CARE PHYSICIAN: Dr. Elijio Miles   GASTROENTEROLOGIST: Dr. Dionne Milo   REASON FOR CONSULTATION: Nausea, vomiting, abdominal pain.   HISTORY OF PRESENT ILLNESS: Mallory Copeland is a 74 year old Caucasian female with significant past medical history of coronary artery disease, reflux, and CVA. She was admitted on 02/18/2011 for the concern of nausea, vomiting, and rash. She said she started with severe nausea and vomiting which started last Monday, about a week to 10 days ago. She tried to manage it at home but progressively became worse. She has been seen and evaluated by Dr. Dionne Milo for this concern. She states that she had an upper endoscopy as well as a colonoscopy recently which were both unremarkable. This was confirmed by her husband as well who is present during interviewing process. The patient does say that eating causes the nausea to be worse. No hematemesis. Complains of soreness to the right side of her abdomen, midepigastric pain which has been constant for the past week and again worse when she eats. She denies any melena or rectal bleeding. Normal bowel pattern prior to the onset of nausea, vomiting, and pain a week ago was on a daily basis but states now bowel movements are 4 to 5 times a day. Today according to her husband it was yellow, very loose in consistency. She has been experiencing excessive rumbling to abdomen. The patient was treated a week ago with antibiotic therapy, unable to recall the name for "a cold".   Additional concern is a rash which started several days ago. It has been pruritic. She has been seen and evaluated by Dr. Lanae Boast who feels that possibly the rash is varicella. She is to be started on antiviral medication. Rash initially started to her foot and then has had a  pustular-like rash across left upper abdomen. Additionally, medical history is significant for being diagnosed with ischemic colitis 4 to 5 months ago. Known history of reflux which the patient states is well controlled.   PAST MEDICAL HISTORY:  1. Coronary artery disease. 2. Gastroesophageal reflux disease.   3. Ischemic cardiomyopathy with ejection fraction of 25 to 35%. 4. History of cerebrovascular accident diagnosed 2012. 5. Hyperlipidemia. 6. Diverticulosis. 7. Diverticulitis. 8. Hypertension. 9. Chronic palpitations. 10. History of ischemic colitis diagnosed 4 to 5 months ago.   ALLERGIES: Aspirin, codeine, penicillin, sulfa, and latex.   MEDICATIONS: 1. Plavix 75 mg a day. 2. Metoprolol ER 100 mg a day.  3. Omeprazole 40 mg a day. 4. Loratadine 1 tablet daily.   FAMILY HISTORY: Mother history of MI at the age of 25. Father had history of MI at the age of 42. Family history is also significant for hypertension and stroke. No history of neoplasm inclusive of colon cancer.    SOCIAL HISTORY: No tobacco. No alcohol or drug use. Married. Resides with her husband.   REVIEW OF SYSTEMS: CONSTITUTIONAL: No fevers. Significant for fatigue and weakness. EYES: No blurred vision, double vision. ENT: No tinnitus or ear pain. RESPIRATORY: No coughing, no wheezing, hemoptysis. CARDIOVASCULAR: No chest pain, orthopnea, or edema. GI: See history of present illness. GU: Denies dysuria or hematuria. ENDOCRINE: No polyuria or nocturia. ENDOCRINE: No anemia, easy bruising or bleeding. SKIN: Significant for rash as stated. See history of present illness. MUSCULOSKELETAL: No arthritis, muscle cramps. NEUROLOGICAL: History of  CVA. No seizure disorder. PSYCH: No depression. No anxiety.   PHYSICAL EXAMINATION:   VITAL SIGNS: Temperature 99, pulse 54, respirations 18, blood pressure 140/65, pulse oximetry 97%.   GENERAL: Well developed, overweight 74 year old Caucasian female, no acute distress noted,  pleasant, appears to be resting well.   HEENT: Normocephalic, atraumatic. Pupils equal, reactive to light. Conjunctivae clear. Sclerae anicteric. Noted slurred speech, probable in correlation status post cerebrovascular accident.   PULMONARY: Symmetric rise and fall of chest. Clear to auscultation throughout.   CARDIOVASCULAR: Regular rate and rhythm, S1, S2.   ABDOMEN: Large, soft, nondistended. Mild discomfort to left upper quadrant epigastric. No evidence of hepatosplenomegaly, bruits, or masses.   RECTAL: Deferred.   MUSCULOSKELETAL: Moving all four extremities. No contractures. No clubbing.   NEUROLOGICAL: No gross neurological deficits.   PSYCH: Alert and oriented x4. Flat affect.   EXTREMITIES: No edema.   LABORATORY, DIAGNOSTIC, AND RADIOLOGICAL DATA: Chemistry panel on January 16th revealed glucose elevated at 133, BUN 36, creatinine 1.81, lipase 82, otherwise within normal limits. Hepatic panel total protein was elevated at 8.7, otherwise within normal limits. Albumin declined to 3.3 today from 4.0 but otherwise hepatic panel within normal limits. Glucose remains elevated today at 109. eGFR was low at 29 on admission and 45 today. CBC on admission hemoglobin 11.7, hematocrit 34.6, declined to 10.0 with hematocrit of 29.9. White count has remained within normal limits. Wound culture right ankle revealed no growth in 18 to 24 hours. Influenza for A and B was negative. Urinalysis revealed +1 blood, otherwise essentially unremarkable. EKG showed sinus rhythm with sinus arrhythmia and occasional PVC complexes. Three-way of abdomen inclusive PA of chest revealed cardiomegaly. No acute cardiopulmonary disease. No evidence of bowel obstruction or perforation. Follow-up of MRI of right hip to evaluate for supraacetabular portion of the right ilium is recommended. Some mild degenerative changes noted with slight heterogeneity of the bone structures. Right hip complete was done today which  revealed right hip shows femoral head located in the acetabulum without evidence of fracture, deformity, or bone destruction. There is some acetabular degenerative changes being mild.    IMPRESSION:  1. Recurrent episodes of nausea and vomiting with associated abdominal pain.  2. Noted change in bowel habits experiencing frequent bowel movements, loose consistency, concern for possible infectious process such as C. difficile given the fact that patient was just recently on antibiotic therapy.  3. Rash, under the care of Infectious Disease, Dr. Legrand Como Blocker, possible varicella.  4. History of cerebrovascular accident in 2012.  5. Ischemic cardiomyopathy.   PLAN:  1. The patient's presentation was discussed with Dr. Verdie Shire. Recommendation is to continue to monitor the patient's symptoms of nausea and vomiting at this time as she has recently had an EGD done which was unremarkable.  2. Do recommend proceeding with stool study for C. difficile toxin A and B as possible underlying etiological cause of her current symptoms. Order placed.   These services provided by Payton Emerald, NP under collaborative agreement with Verdie Shire, MD. Thank you for allowing [place] to participate in her care during her hospitalization.  ____________________________ Payton Emerald, NP dsh:drc D: 02/19/2011 17:00:26 ET T: 02/19/2011 17:46:17 ET JOB#: [id number]  cc: Payton Emerald, NP, <Dictator> Payton Emerald MD ELECTRONICALLY SIGNED 02/20/2011 17:30

## 2014-05-27 NOTE — H&P (Signed)
PATIENT NAME:  Mallory Copeland, Mallory Copeland MR#:  [id number] DATE OF BIRTH:  August 31, 1940  DATE OF ADMISSION:  03/10/2011  PRIMARY CARE PHYSICIAN: Bluford MainSheikh Tejan-Sie, MD  PRESENTING COMPLAINT: Nausea, vomiting, and lack of appetite.   HISTORY OF PRESENT ILLNESS: This is a 74 year old female with past medical history notable for Alzheimer'Delorise Hunkele dementia, coronary artery disease, and hypertension who was recently discharged from [facility] for gastroenteritis. However, since then the patient has complained of persistent nausea and vomiting, and initially diarrhea. She was seen in my office a week ago at which point she stated that her symptoms have persisted, although her diarrhea had resolved. Since then her husband reports that the patient has had very poor oral intake and the patient has felt increasingly weak and lethargic. Recent laboratory panel ordered revealed creatinine of 2.28, which is elevated from baseline of 0.9. The patient denies dizziness, lightheadedness, chest pain, or shortness of breath. She denies any fever or dysuria, but she does admit to oliguria. During her last hospitalization, the patient did have acute kidney injury which was corrected with intravenous fluids and discharge creatinine was normal. The patient was admitted to the hospital for management of her acute kidney injury and further management of her nausea and vomiting. The patient had work-up last year for a similar complaint. EGD was negative for gastritis. Colonoscopy was also unremarkable. I will reconsult gastroenterology for further assessment.   PAST MEDICAL HISTORY:  1. Alzheimer'Zania Kalisz disease. 2. Chronic obstructive pulmonary disease. 3. Coronary artery disease. 4. Esophageal reflux. 5. Hypertension.  6. Left ischemic stroke.  7. Hyperlipidemia.  8. Obesity.   HOME MEDICATIONS: 1. Amlodipine 10 mg daily.  2. Aricept 10 mg daily.  3. Celebrex 20 mg daily.  4. Dulcolax 5 mg p.r.n.  5. Isosorbide mononitrate  60 mg 2 daily.  6. Lasix 20 mg daily.  7. Lipitor 40 mg daily.  8. Lisinopril/hydrochlorothiazide 20/12.5 mg two daily.  9. Loratadine 10 mg daily.  10. Nitrostat 0.4 mg sublingual p.r.n.  11. Pantoprazole sodium 40 mg daily.  12. Plavix 75 mg daily.  13. Promethazine 25 mg every 4 to 6 hours p.r.n.  14. Proventil HFA 2 puffs four times daily p.r.n.  15. Reglan 10 mg twice a day p.r.n.  16. Spiriva inhaler 18 mcg daily.  17. Toprol-XL 100 mg daily.  18. Xanax 0.25 mg twice a day.   ALLERGIES: Aspirin, iodine, penicillin, sulfa drugs.   SOCIAL HISTORY: Ex-tobacco user with 60 to 70 pack-year history. She denies any use of alcohol or illicit drug use. She resides at home with her husband and is independent of activities of daily living.   FAMILY HISTORY: Noncontributory.  REVIEW OF SYSTEMS: CONSTITUTIONAL: The patient denies fever or chills. Confirms recent weight loss. HEAD: Denies any headaches. NECK: Denies any neck pain. EYES: Denies any visual problems. EAR, NOSE, AND THROAT: Denies earache, nasal discharge, or postnasal drip. CARDIOVASCULAR: Denies chest pain or palpitations. PULMONARY: Denies any worsening dyspnea or wheezing. GI: Nausea has worsened, as in History of Present Illness. Denies constipation. GENITOURINARY: Denies hematuria. NEUROLOGICAL: Memory lapses secondary to Alzheimer'Mallory Copeland. SKIN: Denies pruritus, skin lesions, or rashes. ENDOCRINE: Denies heat or cold intolerance.   PHYSICAL EXAMINATION:   GENERAL: Elderly female not in acute distress awake, alert, and oriented x3.   VITAL SIGNS: Temperature 97.8, pulse 60 per minute, blood pressure 102/56.   HEENT: Pupils are equally round and reactive to light. Dry oral mucosa.   LUNGS: Scanty bibasilar rales, fine in character.  CVS:  Heart regular rate and rhythm. S1 and S2 heart sounds are normal. No murmurs or third or fourth sounds.   ABDOMEN: Epigastric tenderness. No palpable organomegaly. Normal bowel sounds. No  bruits.   EXTREMITIES: Bilateral pretibial pitting edema, 1+.   CNS: Awake, alert, and oriented x3. Cranial nerves II through XII grossly intact.   LABORATORY DATA: A stat creatinine 6.6.   IMPRESSION: This is a 74 year old lady with acute kidney injury and intractable nausea and vomiting of unclear etiology with a history of coronary artery disease, cerebrovascular disease and hypertension.   PLAN:  1. We will admit to the medical floor. We will administer fluids, check fraction excretion of urea, obtain renal ultrasound, and order nephrology consultation.  2. Hypertension: We will hold ACE inhibitors and use p.r.n. agents.  3. Chronic arthritic pain: We will hold on NSAIDs.  4. Gastroesophageal reflux disease: We will administer proton pump inhibitors intravenously.  5. Intractable nausea and vomiting: We will consult gastroenterology and possibly obtain a repeat endoscopy. May also consider gastric emptying study.  6. Alzheimer'Veleria Barnhardt disease: Continue Aricept.  7. DVT prophylaxis with low molecular weight heparin.         TIME SPENT ON ADMISSION: Greater than 55% on spent on counseling and coordination of care. ____________________________ Silas Flood. Ellsworth Lennox, MD sat:slb D: 03/10/2011 13:35:52 ET T: 03/10/2011 13:53:07 ET JOB#: [id number]  cc: Marland Mcalpine A. Ellsworth Lennox, MD, <Dictator> Charlesetta Garibaldi MD ELECTRONICALLY SIGNED 03/23/2011 13:20

## 2014-05-27 NOTE — Consult Note (Signed)
Chief Complaint:   Subjective/Chief Complaint some nausea, thoud difficult to get detail. no emesis.  hasnt been eating the clear diet because she doesn't like it.  she does relate some abdominal discomfort after eating.   VITAL SIGNS/ANCILLARY NOTES: **Vital Signs.:   09-Feb-13 18:19   Temperature Temperature (F) 99   Celsius 37.2   Temperature Source oral   Pulse Pulse 76   Respirations Respirations 20   Systolic BP Systolic BP 235   Diastolic BP (mmHg) Diastolic BP (mmHg) 68   Mean BP 85   Pulse Ox % Pulse Ox % 98   Pulse Ox Activity Level  At rest   Oxygen Delivery Room Air/ 21 %   Brief Assessment:   Cardiac Regular    Respiratory clear BS    Gastrointestinal details normal Soft  Nondistended  No masses palpable  Bowel sounds normal  No rebound tenderness  mild epigastric tenderness   Routine Chem:  09-Feb-13 05:46    Glucose, Serum 109   BUN 10   Creatinine (comp) 1.60   Sodium, Serum 140   Potassium, Serum 4.2   Chloride, Serum 107   CO2, Serum 23   Calcium (Total), Serum 8.6   Osmolality (calc) 279   eGFR (African American) 41   eGFR (Non-African American) 34   Anion Gap 10  Routine Hem:  09-Feb-13 05:46    WBC (CBC) 6.0   RBC (CBC) 3.32   Hemoglobin (CBC) 10.1   Hematocrit (CBC) 29.9   Platelet Count (CBC) 142   MCV 90   MCH 30.5   MCHC 33.8   RDW 15.9   Neutrophil % 20.4   Lymphocyte % 65.4   Monocyte % 8.8   Eosinophil % 4.5   Basophil % 0.9   Neutrophil # 1.2   Lymphocyte # 3.9   Monocyte # 0.5   Eosinophil # 0.3   Basophil # 0.1   Radiology Results: Nuclear Med:    08-Feb-13 13:30, Gastric Emptying Study - Nuc Med   Gastric Emptying Study - Nuc Med    REASON FOR EXAM:    Nausea  COMMENTS:       PROCEDURE: NM  - NM GASTRIC EMPTYING STUDY  - Mar 13 2011  1:30PM     RESULT: The patient was administered an oral dose of 2.00 mCi technetium   sulfur colloid administered in an egg sandwich with 8 ounces of water.   The patient, however,  declined to continue with the examination and no   imaging was performed.    IMPRESSION:   1. Incomplete study. The exam was terminated at the patient's request.    Thank you for the opportunity to contribute to the care of your patient.       Verified By: Dionne Ano WALL, M.D., MD   Assessment/Plan:  Assessment/Plan:   Assessment 1) nausea-likely multifactorial.  note dehydration on admission/prerenal. creatinine improving.  of note patietn had biopsies on her last colonoscopy c/w ischemia.  This raises a question of mesenteric insufficiency as a contributor to her n/v and post prandial abdominal pain. Patietn refused to complete the gastric emptying study.    Plan 1) will advance diet to full liquid 2) as renal function improves, would consider CTA to evaluate for mesenteric insufficiency.  following, Dr Dionne Milo to return on monday.   Electronic Signatures: Loistine Simas (MD)  (Signed 09-Feb-13 20:17)  Authored: Chief Complaint, VITAL SIGNS/ANCILLARY NOTES, Brief Assessment, Lab Results, Radiology Results, Assessment/Plan   Last Updated: 09-Feb-13  20:17 by Loistine Simas (MD)

## 2014-05-27 NOTE — Discharge Summary (Signed)
PATIENT NAME:  Mallory Copeland, Mallory Copeland MR#:  [id number] DATE OF BIRTH:  May 17, 1940  DATE OF ADMISSION:  03/10/2011 DATE OF DISCHARGE:  03/19/2011  DISCHARGE DIAGNOSES:  1. Acute kidney injury. 2. Nausea and vomiting.  3. Postherpetic neuralgia, L5 dermatome of right lower extremity. 4. Alzheimer's dementia.  5. Gastroesophageal reflux disease.  6. Hypomagnesemia.   OPERATIONS AND PROCEDURES: 1. Esophagogastroduodenoscopy. 2. Attempted gastric emptying study (patient not able to complete)   DISCHARGE MEDICATIONS: The patient is to resume all home medications with the exception of not taking donepezil (Aricept). Additionally, the patient has been taking the following medications:  1. Gabapentin, 300 mg, take one by mouth three times a day. 2. Reglan, 5 mg, 1 tab by mouth up to every six hours as needed for nausea/stomach upset. 3. Diphenhydramine, 25 mg, 1 tab by mouth with Reglan if you take Reglan. 4. Mirtazapine, 50 mg, take one by mouth every day at bedtime.  5. Slow- Magnesium, 2 tablets by mouth once a day (the patient is to get this over-the-counter)  6. Pantoprazole, 40 mg, one by mouth every morning 30 to 60 minutes prior to eating   HOSPITAL COURSE: This patient is a 74 year old Caucasian female with past medical history notable for Alzheimer's dementia, coronary artery disease, and hypertension who was recently discharged from [facility] for gastroenteritis. However, since then, the patient has complained of persistent nausea and vomiting, and also some diarrhea. The patient was seen by her primary care provider, Dr. Bluford Main, on date of admission, who directly admitted the patient to the hospital. Please see history and physical examination from date of admission for full details regarding the initial presentation, evaluation, and treatment. The patient was admitted to a standard medical floor and was given IV fluids and worked up for her acute kidney injury that  was diagnosed by an I-STAT in the office that revealed a creatinine of 6.6. A repeat creatinine upon arriving at [facility] was 7.97 with a GFR in the single digits. She was started on fluids and had additional evaluation regarding the underlying etiology for her acute kidney injury. Evaluation did reveal prerenal azotemia as the underlying etiology. Nephrotoxic agents were held in the standard fashion during her acute kidney injury. Her hypertension remained stable throughout her hospital stay while off of her hypertensive agents.   Regarding the patient's gastroesophageal reflux disease, she was continued on her proton pump inhibitor while in the hospital and this remained stable and without issue during her stay.   Regarding the patient's nausea and vomiting, she did receive an evaluation for her symptoms while in the hospital. The patient underwent EGD, which was essentially unremarkable/unrevealing. The patient was unable to complete the gastric emptying study. Thus, it cannot be said with certainty that the patient does not have some sort of dysmotility disorder such as delayed gastric emptying. Regarding additional evaluation of her symptoms, the patient's  concomitant acute kidney injury precluded certain tests, such as CT of abdomen and pelvis with contrast and CTA to evaluate for intraabdominal pathology or mesenteric ischemia. She will continue to have further evaluation as an outpatient as she will have close follow-up and further evaluation with Dr. Ellsworth Lennox and Dr. Niel Hummer of [facility]. The patient's Aricept was held during her hospitalization due to the possibility that it contributing to her gastrointestinal symptoms as Aricept is known to have a potential to cause gastrointestinal adverse effects. She remained without any issues while Aricept was held. The patient  was also given, at bedtime, mirtazapine for appetite stimulation, which did appear to have  some beneficial effect. Gastroenterology started the patient on Reglan as well, which also appeared to have beneficial effect for the patient. She received concomitant diphenhydramine while at [facility]. The diphenhydramine was initially ordered for the patient's complaint of pruritus; however, it is also worth noting that diphenhydramine has been shown to possibly prevent extrapyramidal syndrome/symptoms sometimes seen with use of Reglan. Thus, given the patient's tolerance of diphenhydramine (the elderly and those with dementia are at increased risk of adverse effects from the anticholinergic effects of diphenhydramine) while at [facility], it was deemed in her best interest to continue using diphenhydramine as an outpatient, if indeed she does have to use Reglan.  The patient was also noted during her hospitalization to be persistently hypomagnesemic. Thus, she received repletion while at [facility] and will continue oral therapy as an outpatient.   During the patient's stay, she was also diagnosed with post herpetic neuralgia in the L5 dermatome of her right lower extremity and was started on gabapentin with titration in the standard fashion. The patient did report that this had benefit for her.   The patient's acute kidney injury did resolve rather nicely and was overall not difficult to manage. The patient's nausea and vomiting did slowly improve during her stay with continued evaluation and medication adjustments. These two issues were her primary reasons for admission to [facility]. On the day of discharge, she did demonstrate the ability to tolerate her diet, and was eager to return home. Her kidney function was at its baseline (normal).  The patient will continue to have close follow-up with Dr. Bluford MainSheikh Tejan-Sie and Dr. Lurline DelShaukat Iftikhar, both of [facility], for continued evaluation of her  nausea and vomiting. Possible differential diagnoses at this time include central etiologies (possibly also contributed to by her Alzheimer's dementia), functional dyspepsia, and one would also be wise to consider Ogilvie's syndrome (intestinal pseudoobstruction), mesenteric ischemia, and other potential organic diagnoses.   On the day of discharge, the patient was medically ready to be discharged from the hospital and was discharged from the hospital in satisfactory condition.   DIET: Prudent diet advancing as tolerated to regular diet.   ACTIVITY: As tolerated.   FOLLOWUP: Followup with Dr. Bluford MainSheikh Tejan-Sie, of [facility], within 5 days of discharge from [facility], and followup with Dr. Lurline DelShaukat Iftikhar, of [facility], within 10 to 14 days of discharge from [facility].   TIME SPENT: Including coordination of care, patient education and the like was greater than 45 minutes.  ____________________________ Burnett HarryMartin G. Shaune SpittleMayer, PA-C, MSPAS mgm:slb D: 03/19/2011 17:47:00 ET T: 03/20/2011 14:36:39 ET JOB#: [id number]  cc: Burnett HarryMartin G. Stacie AcresMayer, PA, <Dictator> Charlesetta GaribaldiSHEIKH A TEJAN-SIE MD ELECTRONICALLY SIGNED 03/25/2011 14:01 Mariane DuvalMARTIN G Kamille Toomey PA ELECTRONICALLY SIGNED 03/25/2011 16:29

## 2014-05-27 NOTE — Discharge Summary (Signed)
PATIENT NAME:  Mallory Copeland, [name] MR#:  [id number] DATE OF BIRTH:  08/17/1940  DATE OF ADMISSION:  02/20/2011 DATE OF DISCHARGE:  02/21/2011  PRIMARY CARE PHYSICIAN:  Dr. Ellsworth Lennoxejan-Sie.   DISCHARGE DIAGNOSES:  1. Gastroenteritis. 2. Acute kidney injury.  3. Varicella zoster shingles.  4. Right hip strain.   PROCEDURE: MRI of the hip.   CONSULTATION: Infectious disease.   DISCHARGE MEDICATIONS: The patient is to resume her home medications. Additional medications are valacyclovir 1,000 mg 3 times a day x5 days, Naproxen 500 mg twice daily.   HOSPITAL COURSE: The patient was admitted through the Emergency Room with nausea and vomiting and clinical evidence of an acute kidney injury. She was admitted for further management of these problems. Please see history and physical for details. The patient'Terren Jandreau nausea gradually improved. She was able to tolerate a diet by the time she was ready for discharge. She had received a full gastrointestinal work-up for nausea a few months ago, which was completely negative and these symptoms attributed to an acute gastroenteritis. The patient complained of a rash on her right foot. On close examination, she also had similar vesicular lesions on the thigh, which was consistent with a varicella-zoster flare or shingles. Infectious disease was consulted and started her on valacyclovir which she tolerated without any complications. During this stay the patient complained of right hip pain which had preceded her admission and x-ray of her right hip was obtained which was abnormal and MRI was recommended which was performed and showed findings consistent right hip strain or presumed muscle tear. Orthopedic surgery was consulted. I discussed the case with Dr. Gerrit Heckaliff who recommended only nonsteroidals and orthopedic follow-up if the patient'Webster Patrone symptoms fail to improve as an outpatient. The patient'Aceton Kinnear acute kidney disease resolved within 24 hours of receiving fluid. She is being discharged  to home in satisfactory condition.   DISCHARGE INSTRUCTIONS: Low sodium diet.   ACTIVITY: As tolerated.   FOLLOWUP: One to two weeks with Dr. Ellsworth Lennoxejan-Sie.   DISCHARGE PROCESS TIME SPENT: 32 minutes.   ____________________________ Silas FloodSheikh A. Ellsworth Lennoxejan-Sie, MD sat:ap D: 02/21/2011 12:52:13 ET T: 02/21/2011 14:26:05 ET JOB#: [id number]  cc: Sheikh A. Ellsworth Lennoxejan-Sie, MD, <Dictator> Charlesetta GaribaldiSHEIKH A TEJAN-SIE MD ELECTRONICALLY SIGNED 03/06/2011 12:15

## 2014-05-27 NOTE — Consult Note (Signed)
Chief Complaint:   Subjective/Chief Complaint Feels somewhat better. Still c/o feeling sick to stomach and does not want to eat. No vomiting. Will wait to see the effects of Reglan. If no vomiting, may be discharged in a day or so and follow up as OP. Advance diet as she may tolerate solids better than liquids.   Electronic Signatures: Lurline DelIftikhar, Shakim Faith (MD)  (Signed 13-Feb-13 10:39)  Authored: Chief Complaint   Last Updated: 13-Feb-13 10:39 by Lurline DelIftikhar, Bea Duren (MD)

## 2014-08-15 IMAGING — CR DG CHEST 1V PORT
1 series · 1 of 1 positions shown · non-contrast
Comparison: 01/01/2013

REASON FOR EXAM: shock
COMMENTS:

PROCEDURE:     DXR - DXR PORTABLE CHEST SINGLE VIEW  - January 02, 2013  [DATE]
CLINICAL DATA: Shot
PORTABLE CHEST - 1 VIEW

[ap]
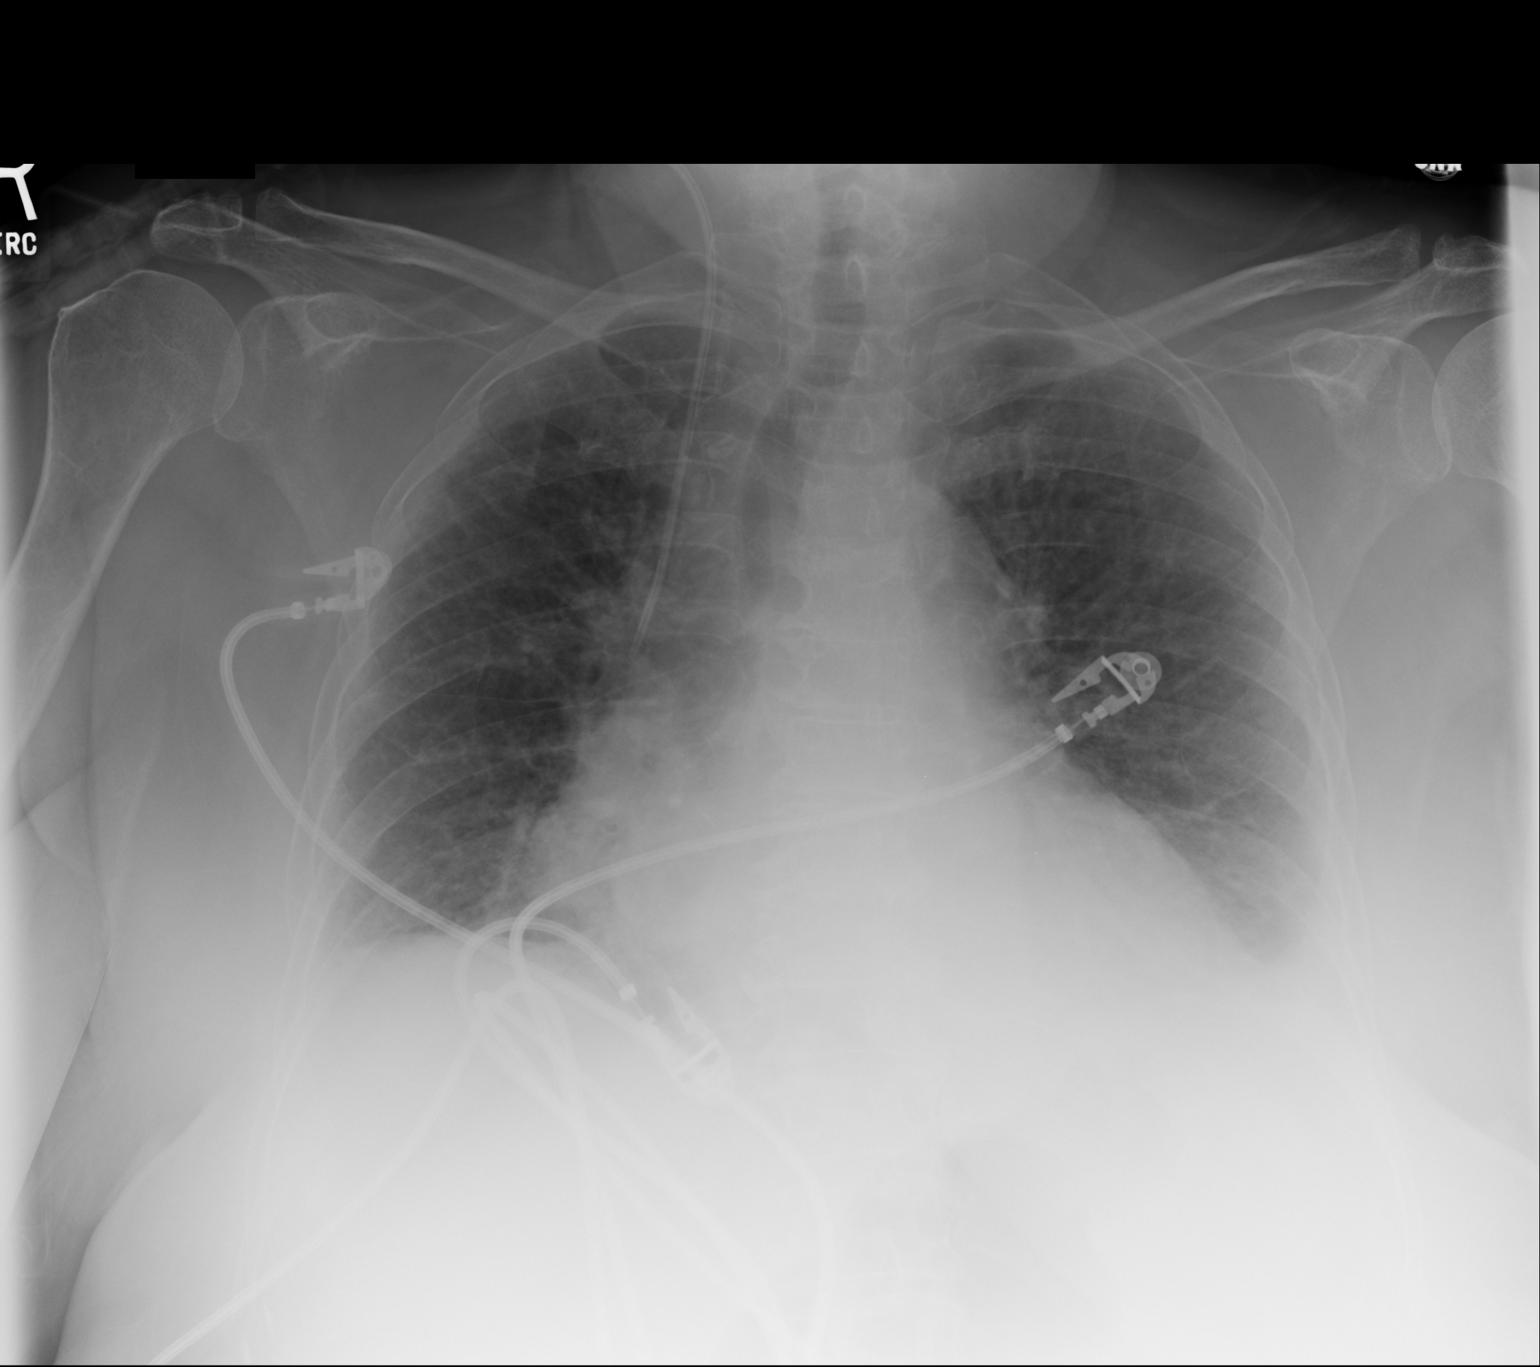

[1 of 1 positions shown; findings below may reference images not displayed]

FINDINGS: Right IJ catheter tip projects over the proximal SVC.
Prominent cardiomediastinal contours are similar to prior.
Interstitial prominence.  Hazy opacity left lung base and to a
lesser extent on the right.  Multilevel degenerative change.  No
pneumothorax.
IMPRESSION: Prominent cardiomediastinal contours.  Hypoaeration with
interstitial / vascular crowding.  Mild interstitial edema not
excluded.

Hazy left greater than right lung base opacities; suspect small
effusions and atelectasis versus infiltrate.
